# Patient Record
Sex: Female | Born: 1970 | Race: White | Hispanic: No | Marital: Married | Smoking: Never smoker
Health system: Southern US, Community
[De-identification: ages and names within clinical notes are randomized; demographics above are authoritative.]

## PROBLEM LIST (undated history)

## (undated) DIAGNOSIS — G8929 Other chronic pain: Secondary | ICD-10-CM

## (undated) DIAGNOSIS — M25562 Pain in left knee: Secondary | ICD-10-CM

## (undated) DIAGNOSIS — M25561 Pain in right knee: Secondary | ICD-10-CM

## (undated) DIAGNOSIS — K5792 Diverticulitis of intestine, part unspecified, without perforation or abscess without bleeding: Secondary | ICD-10-CM

## (undated) DIAGNOSIS — M75100 Unspecified rotator cuff tear or rupture of unspecified shoulder, not specified as traumatic: Secondary | ICD-10-CM

## (undated) DIAGNOSIS — I10 Essential (primary) hypertension: Secondary | ICD-10-CM

## (undated) DIAGNOSIS — E282 Polycystic ovarian syndrome: Secondary | ICD-10-CM

## (undated) HISTORY — PX: CHOLECYSTECTOMY: SHX55

---

## 2002-08-19 ENCOUNTER — Encounter: Admission: RE | Admit: 2002-08-19 | Discharge: 2002-08-19 | Payer: Self-pay

## 2003-07-22 ENCOUNTER — Emergency Department (HOSPITAL_COMMUNITY): Admission: EM | Admit: 2003-07-22 | Discharge: 2003-07-22 | Payer: Self-pay | Admitting: Emergency Medicine

## 2003-07-28 ENCOUNTER — Other Ambulatory Visit: Admission: RE | Admit: 2003-07-28 | Discharge: 2003-07-28 | Payer: Self-pay | Admitting: Obstetrics and Gynecology

## 2003-09-24 ENCOUNTER — Encounter: Payer: Self-pay | Admitting: Obstetrics and Gynecology

## 2003-09-24 ENCOUNTER — Emergency Department (HOSPITAL_COMMUNITY): Admission: EM | Admit: 2003-09-24 | Discharge: 2003-09-25 | Payer: Self-pay | Admitting: Emergency Medicine

## 2003-09-30 ENCOUNTER — Emergency Department (HOSPITAL_COMMUNITY): Admission: EM | Admit: 2003-09-30 | Discharge: 2003-09-30 | Payer: Self-pay | Admitting: Emergency Medicine

## 2003-10-06 ENCOUNTER — Emergency Department (HOSPITAL_COMMUNITY): Admission: EM | Admit: 2003-10-06 | Discharge: 2003-10-06 | Payer: Self-pay | Admitting: Emergency Medicine

## 2003-10-08 ENCOUNTER — Emergency Department (HOSPITAL_COMMUNITY): Admission: EM | Admit: 2003-10-08 | Discharge: 2003-10-09 | Payer: Self-pay | Admitting: Emergency Medicine

## 2004-11-11 ENCOUNTER — Ambulatory Visit (HOSPITAL_COMMUNITY): Admission: RE | Admit: 2004-11-11 | Discharge: 2004-11-11 | Payer: Self-pay | Admitting: Neurology

## 2005-02-06 ENCOUNTER — Other Ambulatory Visit: Admission: RE | Admit: 2005-02-06 | Discharge: 2005-02-06 | Payer: Self-pay | Admitting: Obstetrics and Gynecology

## 2005-09-19 ENCOUNTER — Encounter: Admission: RE | Admit: 2005-09-19 | Discharge: 2005-09-19 | Payer: Self-pay | Admitting: Obstetrics and Gynecology

## 2007-08-19 ENCOUNTER — Encounter: Admission: RE | Admit: 2007-08-19 | Discharge: 2007-08-19 | Payer: Self-pay | Admitting: General Surgery

## 2007-10-31 ENCOUNTER — Emergency Department (HOSPITAL_COMMUNITY): Admission: EM | Admit: 2007-10-31 | Discharge: 2007-10-31 | Payer: Self-pay | Admitting: Emergency Medicine

## 2008-05-26 ENCOUNTER — Emergency Department (HOSPITAL_BASED_OUTPATIENT_CLINIC_OR_DEPARTMENT_OTHER): Admission: EM | Admit: 2008-05-26 | Discharge: 2008-05-26 | Payer: Self-pay | Admitting: Emergency Medicine

## 2008-12-30 ENCOUNTER — Encounter: Admission: RE | Admit: 2008-12-30 | Discharge: 2008-12-30 | Payer: Self-pay | Admitting: Obstetrics and Gynecology

## 2009-10-23 ENCOUNTER — Ambulatory Visit: Payer: Self-pay | Admitting: Diagnostic Radiology

## 2009-10-23 ENCOUNTER — Emergency Department (HOSPITAL_BASED_OUTPATIENT_CLINIC_OR_DEPARTMENT_OTHER): Admission: EM | Admit: 2009-10-23 | Discharge: 2009-10-24 | Payer: Self-pay | Admitting: Emergency Medicine

## 2010-10-08 ENCOUNTER — Encounter: Payer: Self-pay | Admitting: Gastroenterology

## 2010-12-07 LAB — COMPREHENSIVE METABOLIC PANEL
ALT: 12 U/L (ref 0–35)
AST: 16 U/L (ref 0–37)
Albumin: 3.5 g/dL (ref 3.5–5.2)
Alkaline Phosphatase: 77 U/L (ref 39–117)
BUN: 7 mg/dL (ref 6–23)
CO2: 28 mEq/L (ref 19–32)
Calcium: 8.2 mg/dL — ABNORMAL LOW (ref 8.4–10.5)
Chloride: 108 mEq/L (ref 96–112)
Creatinine, Ser: 0.6 mg/dL (ref 0.4–1.2)
GFR calc Af Amer: >60 mL/min (ref >60)
GFR calc non Af Amer: >60 mL/min (ref >60)
Glucose, Bld: 92 mg/dL (ref 70–99)
Potassium: 3.7 mEq/L (ref 3.5–5.1)
Sodium: 142 mEq/L (ref 135–145)
Total Bilirubin: 0.4 mg/dL (ref 0.3–1.2)
Total Protein: 6.5 g/dL (ref 6.0–8.3)

## 2010-12-07 LAB — CBC
HCT: 39.2 % (ref 36.0–46.0)
Hemoglobin: 13.5 g/dL (ref 12.0–15.0)
MCHC: 34.4 g/dL (ref 30.0–36.0)
MCV: 85.3 fL (ref 78.0–100.0)
Platelets: 283 10*3/uL (ref 150–400)
RBC: 4.6 MIL/uL (ref 3.87–5.11)
RDW: 13.1 % (ref 11.5–15.5)
WBC: 11.3 10*3/uL — ABNORMAL HIGH (ref 4.0–10.5)

## 2010-12-07 LAB — URINALYSIS, ROUTINE W REFLEX MICROSCOPIC
Bilirubin Urine: NEGATIVE
Glucose, UA: NEGATIVE mg/dL
Hgb urine dipstick: NEGATIVE
Ketones, ur: NEGATIVE mg/dL
Nitrite: NEGATIVE
Protein, ur: NEGATIVE mg/dL
Specific Gravity, Urine: 1.015 (ref 1.005–1.030)
Urobilinogen, UA: 0.2 mg/dL (ref 0.0–1.0)
pH: 6 (ref 5.0–8.0)

## 2010-12-07 LAB — DIFFERENTIAL
Basophils Absolute: 0.3 10*3/uL — ABNORMAL HIGH (ref 0.0–0.1)
Basophils Relative: 3 % — ABNORMAL HIGH (ref 0–1)
Eosinophils Absolute: 0.2 10*3/uL (ref 0.0–0.7)
Eosinophils Relative: 2 % (ref 0–5)
Lymphocytes Relative: 21 % (ref 12–46)
Lymphs Abs: 2.4 10*3/uL (ref 0.7–4.0)
Monocytes Absolute: 0.7 10*3/uL (ref 0.1–1.0)
Monocytes Relative: 6 % (ref 3–12)
Neutro Abs: 7.6 10*3/uL (ref 1.7–7.7)
Neutrophils Relative %: 68 % (ref 43–77)

## 2010-12-07 LAB — PREGNANCY, URINE: Preg Test, Ur: NEGATIVE

## 2010-12-07 LAB — LIPASE, BLOOD: Lipase: 58 U/L (ref 23–300)

## 2011-01-23 ENCOUNTER — Ambulatory Visit (HOSPITAL_COMMUNITY)
Admission: RE | Admit: 2011-01-23 | Discharge: 2011-01-24 | Disposition: A | Discharge status: Home / Self Care | Payer: BC Managed Care – PPO | Source: Ambulatory Visit | Attending: Obstetrics and Gynecology | Admitting: Obstetrics and Gynecology

## 2011-01-23 ENCOUNTER — Other Ambulatory Visit: Payer: Self-pay | Admitting: Obstetrics and Gynecology

## 2011-01-23 DIAGNOSIS — N920 Excessive and frequent menstruation with regular cycle: Secondary | ICD-10-CM | POA: Insufficient documentation

## 2011-01-23 DIAGNOSIS — N840 Polyp of corpus uteri: Secondary | ICD-10-CM | POA: Insufficient documentation

## 2011-01-23 LAB — CBC
HCT: 41.4 % (ref 36.0–46.0)
Hemoglobin: 13.8 g/dL (ref 12.0–15.0)
MCH: 29 pg (ref 26.0–34.0)
MCHC: 33.3 g/dL (ref 30.0–36.0)
MCV: 87 fL (ref 78.0–100.0)
Platelets: 283 10*3/uL (ref 150–400)
RBC: 4.76 MIL/uL (ref 3.87–5.11)

## 2011-01-23 LAB — PREGNANCY, URINE: Preg Test, Ur: NEGATIVE

## 2011-01-24 NOTE — Op Note (Signed)
  Allison Frost, Allison Frost                 ACCOUNT NO.:  [id number]  MEDICAL RECORD NO.:  [id number]           PATIENT TYPE:  O  LOCATION:  9306                          FACILITY:  [facility]  PHYSICIAN:  Perlita Forbush L. Ziyon Soltau, M.D.DATE OF BIRTH:  1971-03-16  DATE OF PROCEDURE:  01/23/2011 DATE OF DISCHARGE:                              OPERATIVE REPORT   PREOPERATIVE DIAGNOSIS:  Menorrhagia and possible endometrial polyp.  POSTOPERATIVE DIAGNOSIS:  Menorrhagia and possible endometrial polyp.  PROCEDURE:  Dilation and curettage, hysteroscopy, ThermaChoice endometrial ablation.  SURGEON:  Yasha Tibbett L. Vincente Poli, MD  ANESTHESIA:  MAC with local.  FINDINGS:  Small endometrial polyp.  SPECIMENS:  Uterine curettings, sent to pathology.  ESTIMATED BLOOD LOSS:  Minimal.  COMPLICATIONS:  None.  PROCEDURE DESCRIPTION:  The patient was taken to the operating room. Her anesthesia was administered.  She was prepped and draped.  The speculum was inserted to the vagina.  The cervix was grasped with a tenaculum and a paracervical block was performed in standard fashion. The hysteroscope was then inserted and with excellent visualization, we noted a moderate amount of endometrial tissue and possible endometrial polyp.  The hysteroscope was removed and a thorough sharp uterine curettage was performed with retrieval of moderate amount of tissue. Polyp forceps were inserted.  The remainder of the tissue was removed. The hysteroscope was inserted.  The uterine cavity was clean.  The ThermaChoice III balloon was inserted and ThermaChoice endometrial ablation was performed for 8 minutes according to the manufacturer's specifications.  At the end of the procedure, intact balloon was removed.  There was no bleeding noted.  All instruments were removed from the vagina.  All sponge, lap, and instrument counts were correct x2.  The patient went to recovery room in stable condition.     Latonya Nelon L. Vincente Poli,  M.D.    Florestine Avers  D:  01/23/2011  T:  01/24/2011  Job:  [id number]  Electronically Signed by Marcelle Overlie M.D. on 01/24/2011 07:25:55 AM

## 2011-06-08 ENCOUNTER — Other Ambulatory Visit (HOSPITAL_BASED_OUTPATIENT_CLINIC_OR_DEPARTMENT_OTHER): Payer: Self-pay | Admitting: Family Medicine

## 2011-06-08 ENCOUNTER — Ambulatory Visit (HOSPITAL_BASED_OUTPATIENT_CLINIC_OR_DEPARTMENT_OTHER)
Admission: RE | Admit: 2011-06-08 | Discharge: 2011-06-08 | Disposition: A | Discharge status: Home / Self Care | Payer: BC Managed Care – PPO | Source: Ambulatory Visit | Attending: Family Medicine | Admitting: Family Medicine

## 2011-06-08 DIAGNOSIS — K5289 Other specified noninfective gastroenteritis and colitis: Secondary | ICD-10-CM | POA: Insufficient documentation

## 2011-06-08 DIAGNOSIS — R109 Unspecified abdominal pain: Secondary | ICD-10-CM

## 2011-06-08 MED ORDER — IOHEXOL 300 MG/ML  SOLN
100.0000 mL | Freq: Once | INTRAMUSCULAR | Status: AC | PRN
  Administered 2011-06-08: 100 mL via INTRAVENOUS

## 2011-06-21 LAB — URINALYSIS, ROUTINE W REFLEX MICROSCOPIC
Bilirubin Urine: NEGATIVE
Glucose, UA: NEGATIVE
Hgb urine dipstick: NEGATIVE
Ketones, ur: NEGATIVE
Nitrite: NEGATIVE
Protein, ur: NEGATIVE
Specific Gravity, Urine: 1.02
Urobilinogen, UA: 0.2
pH: 7

## 2011-06-21 LAB — COMPREHENSIVE METABOLIC PANEL
ALT: 19
AST: 32
Albumin: 4.2
Alkaline Phosphatase: 91
Chloride: 102
GFR calc Af Amer: >60
Potassium: 4.1
Sodium: 141
Total Bilirubin: 0.7
Total Protein: 7.8

## 2011-06-21 LAB — CBC
HCT: 40.9
Platelets: 301
RDW: 12.5
WBC: 8.6

## 2011-06-21 LAB — DIFFERENTIAL
Basophils Absolute: 0.1
Basophils Relative: 1
Eosinophils Absolute: 0.2
Eosinophils Relative: 3
Monocytes Absolute: 0.4
Monocytes Relative: 4
Neutro Abs: 5.8

## 2011-06-21 LAB — PREGNANCY, URINE: Preg Test, Ur: NEGATIVE

## 2011-12-23 ENCOUNTER — Emergency Department (INDEPENDENT_AMBULATORY_CARE_PROVIDER_SITE_OTHER): Payer: BC Managed Care – PPO

## 2011-12-23 ENCOUNTER — Encounter (HOSPITAL_BASED_OUTPATIENT_CLINIC_OR_DEPARTMENT_OTHER): Payer: Self-pay | Admitting: *Deleted

## 2011-12-23 ENCOUNTER — Emergency Department (HOSPITAL_BASED_OUTPATIENT_CLINIC_OR_DEPARTMENT_OTHER)
Admission: EM | Admit: 2011-12-23 | Discharge: 2011-12-23 | Disposition: A | Discharge status: Home / Self Care | Payer: BC Managed Care – PPO | Attending: Emergency Medicine | Admitting: Emergency Medicine

## 2011-12-23 DIAGNOSIS — K429 Umbilical hernia without obstruction or gangrene: Secondary | ICD-10-CM

## 2011-12-23 DIAGNOSIS — K5792 Diverticulitis of intestine, part unspecified, without perforation or abscess without bleeding: Secondary | ICD-10-CM

## 2011-12-23 DIAGNOSIS — N859 Noninflammatory disorder of uterus, unspecified: Secondary | ICD-10-CM

## 2011-12-23 DIAGNOSIS — K5732 Diverticulitis of large intestine without perforation or abscess without bleeding: Secondary | ICD-10-CM | POA: Insufficient documentation

## 2011-12-23 DIAGNOSIS — R935 Abnormal findings on diagnostic imaging of other abdominal regions, including retroperitoneum: Secondary | ICD-10-CM

## 2011-12-23 DIAGNOSIS — N852 Hypertrophy of uterus: Secondary | ICD-10-CM

## 2011-12-23 DIAGNOSIS — R1032 Left lower quadrant pain: Secondary | ICD-10-CM | POA: Insufficient documentation

## 2011-12-23 LAB — URINALYSIS, ROUTINE W REFLEX MICROSCOPIC
Bilirubin Urine: NEGATIVE
Glucose, UA: NEGATIVE mg/dL
Hgb urine dipstick: NEGATIVE
Protein, ur: NEGATIVE mg/dL

## 2011-12-23 LAB — PREGNANCY, URINE: Preg Test, Ur: NEGATIVE

## 2011-12-23 LAB — BASIC METABOLIC PANEL
BUN: 6 mg/dL (ref 6–23)
Chloride: 103 mEq/L (ref 96–112)
Creatinine, Ser: 0.6 mg/dL (ref 0.50–1.10)
Glucose, Bld: 85 mg/dL (ref 70–99)
Potassium: 3.4 mEq/L — ABNORMAL LOW (ref 3.5–5.1)

## 2011-12-23 LAB — CBC
HCT: 39.6 % (ref 36.0–46.0)
Hemoglobin: 13.6 g/dL (ref 12.0–15.0)
MCHC: 34.3 g/dL (ref 30.0–36.0)
MCV: 86.3 fL (ref 78.0–100.0)

## 2011-12-23 MED ORDER — IOHEXOL 300 MG/ML  SOLN
100.0000 mL | Freq: Once | INTRAMUSCULAR | Status: AC | PRN
  Administered 2011-12-23: 100 mL via INTRAVENOUS

## 2011-12-23 MED ORDER — ONDANSETRON HCL 4 MG/2ML IJ SOLN
4.0000 mg | Freq: Once | INTRAMUSCULAR | Status: AC
  Administered 2011-12-23: 4 mg via INTRAVENOUS
  Filled 2011-12-23: qty 2

## 2011-12-23 MED ORDER — CIPROFLOXACIN HCL 500 MG PO TABS: 500.0000 mg | ORAL_TABLET | Freq: Two times a day (BID) | ORAL | Status: AC

## 2011-12-23 MED ORDER — HYDROMORPHONE HCL PF 1 MG/ML IJ SOLN
1.0000 mg | Freq: Once | INTRAMUSCULAR | Status: AC
  Administered 2011-12-23: 1 mg via INTRAVENOUS
  Filled 2011-12-23: qty 1

## 2011-12-23 MED ORDER — IOHEXOL 300 MG/ML  SOLN
20.0000 mL | INTRAMUSCULAR | Status: AC
  Administered 2011-12-23: 20 mL via ORAL

## 2011-12-23 MED ORDER — HYDROCODONE-ACETAMINOPHEN 5-325 MG PO TABS: 2.0000 | ORAL_TABLET | ORAL | Status: AC | PRN

## 2011-12-23 MED ORDER — METRONIDAZOLE 500 MG PO TABS: 500.0000 mg | ORAL_TABLET | Freq: Two times a day (BID) | ORAL | Status: DC

## 2011-12-23 MED ORDER — ONDANSETRON HCL 4 MG PO TABS: 4.0000 mg | ORAL_TABLET | Freq: Four times a day (QID) | ORAL | Status: DC

## 2011-12-23 MED ORDER — METRONIDAZOLE 500 MG PO TABS: 500.0000 mg | ORAL_TABLET | Freq: Two times a day (BID) | ORAL | Status: AC

## 2011-12-23 MED ORDER — ONDANSETRON HCL 4 MG PO TABS: 4.0000 mg | ORAL_TABLET | Freq: Four times a day (QID) | ORAL | Status: AC

## 2011-12-23 MED ORDER — CIPROFLOXACIN HCL 500 MG PO TABS: 500.0000 mg | ORAL_TABLET | Freq: Two times a day (BID) | ORAL | Status: DC

## 2011-12-23 NOTE — ED Provider Notes (Signed)
History     CSN: [id number]  Arrival date & time 12/23/11  1357   First MD Initiated Contact with Patient 12/23/11 1446      Chief Complaint  Patient presents with  . Abdominal Pain    (Consider location/radiation/quality/duration/timing/severity/associated sxs/prior treatment) HPI  Patient presents to the emergency department from the [facility] walk-in clinic with complaints of abdominal pain starting 2 days ago. The patient has a past medical history of diverticulitis and she states that this feels like the same. She has a gastrointestinal doctor at [facility] who she called this morning or for her to the [place] walk-in clinic where they referred her here to the ED. The patient states her pain is left lower quadrant. Denies having diarrhea or vomiting but states that the pain is so bad that she could not take it anymore. She has also been gassy. Has a history of cholecystectomy.admits to still having appendix. The patient denies any other complaints or concerns at this time.  History reviewed. No pertinent past medical history.  Past Surgical History  Procedure Date  . Cholecystectomy     History reviewed. No pertinent family history.  History  Substance Use Topics  . Smoking status: Never Smoker   . Smokeless tobacco: Not on file  . Alcohol Use: No    OB History    Grav Para Term Preterm Abortions TAB SAB Ect Mult Living                  Review of Systems  All other systems reviewed and are negative.    Allergies  Review of patient's allergies indicates no known allergies.  Home Medications  No current outpatient prescriptions on file.  BP 146/86  Pulse 70  Temp(Src) 98.1 F (36.7 C) (Oral)  Resp 20  Ht 5\' 4"  (1.626 m)  Wt 235 lb (106.595 kg)  BMI 40.34 kg/m2  SpO2 99%  LMP 12/09/2011  Physical Exam  Nursing note and vitals reviewed. Constitutional: She appears well-developed and well-nourished. No distress.  HENT:  Head: Normocephalic and atraumatic.    Eyes: Pupils are equal, round, and reactive to light.  Neck: Normal range of motion. Neck supple.  Cardiovascular: Normal rate and regular rhythm.   Pulmonary/Chest: Effort normal.  Abdominal: Soft. There is tenderness in the left lower quadrant. There is no rigidity, no rebound and no guarding.  Neurological: She is alert.  Skin: Skin is warm and dry.    ED Course  Procedures (including critical care time)   Labs Reviewed  URINALYSIS, ROUTINE W REFLEX MICROSCOPIC  PREGNANCY, URINE   No results found.   No diagnosis found.    MDM  pts last CT scan was 6 months ago and it was positive for diverticulitis.   Pt handed off to Langston Masker, PA-C at end of shift      Dorthula Matas, [place] 12/23/11 [phone]

## 2011-12-23 NOTE — Discharge Instructions (Signed)
Diverticulitis Small pockets or "bubbles" can develop in the wall of the intestine. Diverticulitis is when those pockets become infected and inflamed. This causes stomach pain (usually on the left side). HOME CARE  Take all medicine as told by your doctor.   Try a clear liquid diet (broth, tea, or water) for as long as told by your doctor.   Keep all follow-up visits with your doctor.   You may be put on a low-fiber diet once you start feeling better. Here are foods that have low-fiber:   White breads, cereals, rice, and pasta.   Cooked fruits and vegetables or soft fresh fruits and vegetables without the skin.   Ground or well-cooked tender beef, ham, veal, lamb, pork, or poultry.   Eggs and seafood.   After you are doing well on the low-fiber diet, you may be put on a high-fiber diet. Here are ways to increase your fiber:   Choose whole-grain breads, cereals, pasta, and brown rice.   Choose fruits and vegetables with skin on. Do not overcook the vegetables.   Choose nuts, seeds, legumes, dried peas, beans, and lentils.   Look for food products that have more than 3 grams of fiber per serving on the food label.  GET HELP RIGHT AWAY IF:  Your pain does not get better or gets worse.   You have trouble eating food.   You are not pooping (having bowel movements) like normal.   You have a temperature by mouth above 102 F (38.9 C), not controlled by medicine.   You keep throwing up (vomiting).   You have bloody or black, tarry poop (stools).   You are getting worse and not better.  MAKE SURE YOU:   Understand these instructions.   Will watch your condition.   Will get help right away if you are not doing well or get worse.  Document Released: 02/21/2008 Document Revised: 08/24/2011 Document Reviewed: 07/26/2009 Exit[employer] Patient Information 2012 [employer]. 

## 2011-12-23 NOTE — ED Notes (Signed)
Pt sent here from [facility] for further testing. C/O left side pain x 2 days. "Gassy after eating".

## 2011-12-23 NOTE — ED Provider Notes (Signed)
History     CSN: [id number]  Arrival date & time 12/23/11  1357   First MD Initiated Contact with Patient 12/23/11 1446      Chief Complaint  Patient presents with  . Abdominal Pain    (Consider location/radiation/quality/duration/timing/severity/associated sxs/prior treatment) HPI  History reviewed. No pertinent past medical history.  Past Surgical History  Procedure Date  . Cholecystectomy     History reviewed. No pertinent family history.  History  Substance Use Topics  . Smoking status: Never Smoker   . Smokeless tobacco: Not on file  . Alcohol Use: No    OB History    Grav Para Term Preterm Abortions TAB SAB Ect Mult Living                  Review of Systems  Allergies  Review of patient's allergies indicates no known allergies.  Home Medications   Current Outpatient Rx  Name Route Sig Dispense Refill  . ALLERGY D-12 PO Oral Take 1 tablet by mouth daily.    Marland Kitchen VITAMIN D3 2000 UNITS PO TABS Oral Take 1 tablet by mouth daily.    . OMEGA-3 FATTY ACIDS 1000 MG PO CAPS Oral Take 2 g by mouth daily.    Marland Kitchen FLURBIPROFEN 100 MG PO TABS Oral Take 100 mg by mouth 2 (two) times daily.    Marland Kitchen GLUCOSAMINE-CHONDROITIN 500-400 MG PO TABS Oral Take 1 tablet by mouth 2 (two) times daily. Hold while in hospital    . THERA M PLUS PO TABS Oral Take 1 tablet by mouth daily.    Marland Kitchen NARATRIPTAN HCL 2.5 MG PO TABS Oral Take 2.5 mg by mouth as needed. Take one (1) tablet at onset of headache; if returns or does not resolve, may repeat after 4 hours; do not exceed five (5) mg in 24 hours. For headache      BP 124/92  Pulse 68  Temp(Src) 98.1 F (36.7 C) (Oral)  Resp 18  Ht 5\' 4"  (1.626 m)  Wt 235 lb (106.595 kg)  BMI 40.34 kg/m2  SpO2 99%  LMP 12/09/2011  Physical Exam  ED Course  Procedures (including critical care time)  Labs Reviewed  BASIC METABOLIC PANEL - Abnormal; Notable for the following:    Potassium 3.4 (*)    All other components within normal limits    URINALYSIS, ROUTINE W REFLEX MICROSCOPIC  PREGNANCY, URINE  CBC   Ct Abdomen Pelvis W Contrast  12/23/2011  *RADIOLOGY REPORT*  Clinical Data: Left lower quadrant pain  CT ABDOMEN AND PELVIS WITH CONTRAST  Technique:  Multidetector CT imaging of the abdomen and pelvis was performed following the standard protocol during bolus administration of intravenous contrast.  Contrast: OMNIPAQUE IOHEXOL 300 MG/ML  SOLN  Comparison: 06/08/2011  Findings: Lung bases are unremarkable.  Sagittal images of the spine are unremarkable.  The patient is status post cholecystectomy.  Mixed hypo and hyperdense lesion in the right hepatic lobe inferiorly measures about 1 cm stable in size and appearance from prior exam probable benign in nature.  No intrahepatic biliary ductal dilatation.  The pancreas, spleen and adrenals are unremarkable.  Kidneys are symmetrical in size and enhancement without evidence of focal mass.  No hydronephrosis or hydroureter.  Abdominal aorta is unremarkable.  There is a small umbilical hernia containing fat without evidence of acute complication.  Normal appendix is clearly visualized in axial image 56.  There is no pericecal inflammation.  In axial image 49 there is a focal ring-like stranding  of the mesenteric fat just anterior to the left colon in the left lower quadrant the findings are suspicious for epiploic appendagitis or less likely mild diverticulitis.  No thickening of the colonic wall is noted.  No mesenteric fluid collection or abscess is noted.  Mild enlarged uterus.  Small amount of fluid noted in the posterior aspect of the endometrial canal.  There is a nodular prominence of the uterine fundus measures about 2 cm.  This is suspicious for a myometrial fibroid.  The ovaries are unremarkable.  Correlation with ultrasound could be performed.  The urinary bladder is unremarkable.  Delayed renal images shows bilateral renal symmetrical excretion.  IMPRESSION:  1. There is focal  ring-like stranding in the left lower quadrant mesentery, anterior to descending colon. Best seen in the axial image 25 of series 7.  Findings are highly suspicious for epiploic appendagitis or less likely focal diverticulitis. 2.  No pericolonic abscess or fluid collection. 3.  Normal appendix.  4.  No hydronephrosis or hydroureter. 5.  Small umbilical hernia without evidence of acute complication. 6.  There is enlarged uterus.  A nodular prominence of the uterine fundus suspicious for a myometrial fibroid measures about 2 cm. Correlation with pelvic ultrasound could be performed.  Original Report Authenticated By: Natasha Mead, M.D.     No diagnosis found.    MDM  Ct shows diverticulitis.  Pt given rx for flagyl, cipro and hydrocodone.  Pt advised to follow up with her MD for recheck on Monday        Lonia Skinner Per[place], [place] 12/23/11 Rickey Primus

## 2011-12-24 NOTE — ED Provider Notes (Signed)
Medical screening examination/treatment/procedure(s) were performed by non-physician practitioner and as supervising physician I was immediately available for consultation/collaboration.   Celene Kras, MD 12/24/11 0700

## 2011-12-25 NOTE — ED Provider Notes (Signed)
History/physical exam/procedure(s) were performed by non-physician practitioner and as supervising physician I was immediately available for consultation/collaboration. I have reviewed all notes and am in agreement with care and plan.   Hilario Quarry, MD 12/25/11 [phone]

## 2012-01-11 ENCOUNTER — Other Ambulatory Visit: Payer: Self-pay | Admitting: Obstetrics and Gynecology

## 2012-05-14 ENCOUNTER — Other Ambulatory Visit: Payer: Self-pay | Admitting: Gastroenterology

## 2012-05-14 DIAGNOSIS — K5732 Diverticulitis of large intestine without perforation or abscess without bleeding: Secondary | ICD-10-CM

## 2012-05-16 ENCOUNTER — Ambulatory Visit
Admission: RE | Admit: 2012-05-16 | Discharge: 2012-05-16 | Disposition: A | Discharge status: Home / Self Care | Payer: BC Managed Care – PPO | Source: Ambulatory Visit | Attending: Gastroenterology | Admitting: Gastroenterology

## 2012-05-16 ENCOUNTER — Other Ambulatory Visit: Payer: BC Managed Care – PPO

## 2012-05-16 DIAGNOSIS — K5732 Diverticulitis of large intestine without perforation or abscess without bleeding: Secondary | ICD-10-CM

## 2012-05-16 MED ORDER — IOHEXOL 300 MG/ML  SOLN
125.0000 mL | Freq: Once | INTRAMUSCULAR | Status: AC | PRN
  Administered 2012-05-16: 125 mL via INTRAVENOUS

## 2012-05-17 ENCOUNTER — Other Ambulatory Visit: Payer: BC Managed Care – PPO

## 2013-01-31 ENCOUNTER — Other Ambulatory Visit: Payer: Self-pay | Admitting: Obstetrics and Gynecology

## 2013-10-21 ENCOUNTER — Other Ambulatory Visit: Payer: Self-pay | Admitting: Gastroenterology

## 2013-10-21 DIAGNOSIS — R1012 Left upper quadrant pain: Secondary | ICD-10-CM

## 2013-10-24 ENCOUNTER — Ambulatory Visit
Admission: RE | Admit: 2013-10-24 | Discharge: 2013-10-24 | Disposition: A | Discharge status: Home / Self Care | Payer: BC Managed Care – PPO | Source: Ambulatory Visit | Attending: Gastroenterology | Admitting: Gastroenterology

## 2013-10-24 DIAGNOSIS — R1012 Left upper quadrant pain: Secondary | ICD-10-CM

## 2013-10-24 MED ORDER — IOHEXOL 300 MG/ML  SOLN
125.0000 mL | Freq: Once | INTRAMUSCULAR | Status: AC | PRN
  Administered 2013-10-24: 125 mL via INTRAVENOUS

## 2014-03-19 ENCOUNTER — Other Ambulatory Visit: Payer: Self-pay | Admitting: Obstetrics and Gynecology

## 2014-03-23 LAB — CYTOLOGY - PAP

## 2015-05-13 ENCOUNTER — Other Ambulatory Visit: Payer: Self-pay | Admitting: Obstetrics and Gynecology

## 2015-05-14 LAB — CYTOLOGY - PAP

## 2015-10-29 ENCOUNTER — Other Ambulatory Visit (HOSPITAL_BASED_OUTPATIENT_CLINIC_OR_DEPARTMENT_OTHER): Payer: Self-pay | Admitting: Family Medicine

## 2015-10-29 ENCOUNTER — Ambulatory Visit (HOSPITAL_BASED_OUTPATIENT_CLINIC_OR_DEPARTMENT_OTHER)
Admission: RE | Admit: 2015-10-29 | Discharge: 2015-10-29 | Disposition: A | Discharge status: Home / Self Care | Payer: 59 | Source: Ambulatory Visit | Attending: Family Medicine | Admitting: Family Medicine

## 2015-10-29 ENCOUNTER — Encounter (HOSPITAL_BASED_OUTPATIENT_CLINIC_OR_DEPARTMENT_OTHER): Payer: Self-pay

## 2015-10-29 DIAGNOSIS — K921 Melena: Secondary | ICD-10-CM | POA: Insufficient documentation

## 2015-10-29 DIAGNOSIS — R109 Unspecified abdominal pain: Secondary | ICD-10-CM

## 2015-10-29 DIAGNOSIS — K439 Ventral hernia without obstruction or gangrene: Secondary | ICD-10-CM | POA: Insufficient documentation

## 2015-10-29 DIAGNOSIS — D1803 Hemangioma of intra-abdominal structures: Secondary | ICD-10-CM | POA: Insufficient documentation

## 2015-10-29 DIAGNOSIS — R509 Fever, unspecified: Secondary | ICD-10-CM | POA: Diagnosis not present

## 2015-10-29 DIAGNOSIS — K5730 Diverticulosis of large intestine without perforation or abscess without bleeding: Secondary | ICD-10-CM | POA: Insufficient documentation

## 2015-10-29 DIAGNOSIS — K529 Noninfective gastroenteritis and colitis, unspecified: Secondary | ICD-10-CM | POA: Insufficient documentation

## 2015-10-29 DIAGNOSIS — R1084 Generalized abdominal pain: Secondary | ICD-10-CM | POA: Insufficient documentation

## 2015-10-29 MED ORDER — IOHEXOL 300 MG/ML  SOLN
100.0000 mL | Freq: Once | INTRAMUSCULAR | Status: AC | PRN
  Administered 2015-10-29: 100 mL via INTRAVENOUS

## 2016-10-09 ENCOUNTER — Ambulatory Visit (HOSPITAL_COMMUNITY)
Admission: RE | Admit: 2016-10-09 | Discharge: 2016-10-09 | Disposition: A | Discharge status: Home / Self Care | Payer: BLUE CROSS/BLUE SHIELD | Source: Ambulatory Visit | Attending: Orthopedic Surgery | Admitting: Orthopedic Surgery

## 2016-10-09 ENCOUNTER — Other Ambulatory Visit (HOSPITAL_COMMUNITY): Payer: Self-pay | Admitting: Orthopedic Surgery

## 2016-10-09 DIAGNOSIS — M79604 Pain in right leg: Secondary | ICD-10-CM

## 2016-10-09 DIAGNOSIS — M7989 Other specified soft tissue disorders: Principal | ICD-10-CM

## 2016-10-09 NOTE — Progress Notes (Signed)
Preliminary results by tech - Venous Duplex Lower Ext. Right Completed. Negative for deep and superficial vein thrombosis in the right leg. Called Dr. Alvester Morin office and left message on voicemail to call.  Oda Cogan, BS, RDMS, RVT

## 2016-10-16 ENCOUNTER — Emergency Department (HOSPITAL_COMMUNITY)
Admission: EM | Admit: 2016-10-16 | Discharge: 2016-10-16 | Disposition: A | Discharge status: Home / Self Care | Payer: BLUE CROSS/BLUE SHIELD | Attending: Dermatology | Admitting: Dermatology

## 2016-10-16 ENCOUNTER — Encounter (HOSPITAL_COMMUNITY): Payer: Self-pay | Admitting: *Deleted

## 2016-10-16 DIAGNOSIS — Z5321 Procedure and treatment not carried out due to patient leaving prior to being seen by health care provider: Secondary | ICD-10-CM | POA: Insufficient documentation

## 2016-10-16 DIAGNOSIS — I10 Essential (primary) hypertension: Secondary | ICD-10-CM | POA: Insufficient documentation

## 2016-10-16 HISTORY — DX: Unspecified rotator cuff tear or rupture of unspecified shoulder, not specified as traumatic: M75.100

## 2016-10-16 HISTORY — DX: Essential (primary) hypertension: I10

## 2016-10-16 NOTE — ED Triage Notes (Signed)
PT states hx of htn that is uncontrolled despite taking medication.  Pt denies headache, dizziness or blurred vision.  She feels her high blood pressure is related to uncontrolled pain from bil rotator cuff tears.

## 2016-10-16 NOTE — ED Notes (Signed)
Pt states she called her PCP and they will provide pain medication to patient; she states she does not want to wait to be seen by an EDP and leaves the ER ambulatory with steady gait and family

## 2016-10-19 ENCOUNTER — Other Ambulatory Visit: Payer: Self-pay | Admitting: *Deleted

## 2016-10-19 ENCOUNTER — Ambulatory Visit (INDEPENDENT_AMBULATORY_CARE_PROVIDER_SITE_OTHER): Payer: BLUE CROSS/BLUE SHIELD | Admitting: Orthopedic Surgery

## 2016-10-19 ENCOUNTER — Encounter (INDEPENDENT_AMBULATORY_CARE_PROVIDER_SITE_OTHER): Payer: Self-pay | Admitting: Orthopedic Surgery

## 2016-10-19 ENCOUNTER — Ambulatory Visit (INDEPENDENT_AMBULATORY_CARE_PROVIDER_SITE_OTHER): Payer: BLUE CROSS/BLUE SHIELD | Admitting: Neurology

## 2016-10-19 DIAGNOSIS — G43809 Other migraine, not intractable, without status migrainosus: Secondary | ICD-10-CM | POA: Diagnosis not present

## 2016-10-19 DIAGNOSIS — M75112 Incomplete rotator cuff tear or rupture of left shoulder, not specified as traumatic: Secondary | ICD-10-CM | POA: Diagnosis not present

## 2016-10-19 DIAGNOSIS — R252 Cramp and spasm: Secondary | ICD-10-CM | POA: Diagnosis not present

## 2016-10-19 DIAGNOSIS — R200 Anesthesia of skin: Secondary | ICD-10-CM

## 2016-10-19 DIAGNOSIS — I10 Essential (primary) hypertension: Secondary | ICD-10-CM | POA: Diagnosis not present

## 2016-10-19 DIAGNOSIS — M25511 Pain in right shoulder: Secondary | ICD-10-CM | POA: Diagnosis not present

## 2016-10-19 DIAGNOSIS — M545 Low back pain: Secondary | ICD-10-CM | POA: Diagnosis not present

## 2016-10-19 DIAGNOSIS — R9431 Abnormal electrocardiogram [ECG] [EKG]: Secondary | ICD-10-CM | POA: Diagnosis not present

## 2016-10-19 NOTE — Procedures (Signed)
[facility]  [place], [place]  [place], [place] [address] Tel: [phone] Fax:  [phone] Test Date:  10/19/2016  Patient: Allison Frost DOB: 1971/06/29 Physician: Narda Amber, DO  Sex: Female Height: 5\' 4"  Ref Phys: Flossie Dibble, M.D.  ID#: [id number] Temp: 32.7C Technician: Jerilynn Mages. Dean   Patient Complaints: This is a 46 year old female referred for evaluation of bilateral arm pain and paresthesias.   NCV & EMG Findings: Extensive electrodiagnostic testing of the left upper extremity and additional studies of the right shows:  1. Bilateral median, ulnar, and mixed palmar sensory responses are within normal limits. 2. Bilateral median and ulnar motor responses are within normal limits. 3. There is no evidence of active or chronic motor axon loss changes affecting any of the tested muscles. Motor unit configuration and recruitment pattern is within normal limits.  Impression: This is a normal study of bilateral upper extremities.  In particular, there is no evidence of a cervical radiculopathy or carpal tunnel syndrome.  ___________________________ Narda Amber, DO    Nerve Conduction Studies Anti Sensory Summary Table   Site NR Peak (ms) Norm Peak (ms) P-T Amp (V) Norm P-T Amp  Left Median Anti Sensory (2nd Digit)  32.7C  Wrist    3.3 <3.4 23.5 >20  Right Median Anti Sensory (2nd Digit)  32.7C  Wrist    3.4 <3.4 48.3 >20  Left Ulnar Anti Sensory (5th Digit)  32.7C  Wrist    2.8 <3.1 15.7 >12  Right Ulnar Anti Sensory (5th Digit)  32.7C  Wrist    3.0 <3.1 15.7 >12   Motor Summary Table   Site NR Onset (ms) Norm Onset (ms) O-P Amp (mV) Norm O-P Amp Site1 Site2 Delta-0 (ms) Dist (cm) Vel (m/s) Norm Vel (m/s)  Left Median Motor (Abd Poll Brev)  32.7C  Wrist    3.5 <3.9 7.6 >6 Elbow Wrist 3.9 23.0 59 >50  Elbow    7.4  7.4         Right Median Motor (Abd Poll Brev)  32.7C  Wrist    3.4 <3.9 6.4 >6 Elbow Wrist 5.0 26.0 52 >50  Elbow    8.4  5.4          Left Ulnar Motor (Abd Dig Minimi)  32.7C  Wrist    2.9 <3.1 8.1 >7 B Elbow Wrist 3.2 19.0 59 >50  B Elbow    6.1  7.5  A Elbow B Elbow 1.8 10.0 56 >50  A Elbow    7.9  7.5         Right Ulnar Motor (Abd Dig Minimi)  32.7C  Wrist    2.8 <3.1 10.4 >7 B Elbow Wrist 3.3 19.0 58 >50  B Elbow    6.1  10.3  A Elbow B Elbow 1.6 10.0 62 >50  A Elbow    7.7  9.7          Comparison Summary Table   Site NR Peak (ms) Norm Peak (ms) P-T Amp (V) Site1 Site2 Delta-P (ms) Norm Delta (ms)  Left Median/Ulnar Palm Comparison (Wrist - 8cm)  32.7C  Median Palm    2.1 <2.2 56.1 Median Palm Ulnar Palm 0.2   Ulnar Palm    1.9 <2.2 16.4      Right Median/Ulnar Palm Comparison (Wrist - 8cm)  32.7C  Median Palm    2.1 <2.2 84.9 Median Palm Ulnar Palm 0.3   Ulnar Palm    1.8 <2.2 20.6  EMG   Side Muscle Ins Act Fibs Psw Fasc Number Recrt Dur Dur. Amp Amp. Poly Poly. Comment  Right Deltoid Nml Nml Nml Nml Nml Nml Nml Nml Nml Nml Nml Nml N/A  Right 1stDorInt Nml Nml Nml Nml Nml Nml Nml Nml Nml Nml Nml Nml N/A  Right Ext Indicis Nml Nml Nml Nml Nml Nml Nml Nml Nml Nml Nml Nml N/A  Right PronatorTeres Nml Nml Nml Nml Nml Nml Nml Nml Nml Nml Nml Nml N/A  Right Biceps Nml Nml Nml Nml Nml Nml Nml Nml Nml Nml Nml Nml N/A  Right Triceps Nml Nml Nml Nml Nml Nml Nml Nml Nml Nml Nml Nml N/A  Left 1stDorInt Nml Nml Nml Nml Nml Nml Nml Nml Nml Nml Nml Nml N/A  Left Ext Indicis Nml Nml Nml Nml Nml Nml Nml Nml Nml Nml Nml Nml N/A  Left PronatorTeres Nml Nml Nml Nml Nml Nml Nml Nml Nml Nml Nml Nml N/A  Left Biceps Nml Nml Nml Nml Nml Nml Nml Nml Nml Nml Nml Nml N/A  Left Triceps Nml Nml Nml Nml Nml Nml Nml Nml Nml Nml Nml Nml N/A  Left Deltoid Nml Nml Nml Nml Nml Nml Nml Nml Nml Nml Nml Nml N/A      Waveforms:

## 2016-10-19 NOTE — Progress Notes (Signed)
Office Visit Note   Patient: Allison Frost           Date of Birth: 06/17/1971           MRN: [id number] Visit Date: 10/19/2016              Requested by: No referring provider defined for this encounter. PCP: Leotis Pain, DO  Chief Complaint  Patient presents with  . Right Shoulder - Pain  . Left Shoulder - Pain  . Right Leg - Follow-up    Gastrocnemius calf spasms and sprain.     HPI: Patient presents today for two separate medical issues. She complains of right calf strain with spasms. And bilateral shoulder pain. Patient states she woke up with massive muscles spasm in right calf. She originally went to orthopedic urgent care for muscle spasms. She has had a doppler negative for dvt.She was told by her headache doctor that she needed orthopedic evaluation.  She has chronic pain bilateral shoulders. She states left shoulder is hurting worse than the right. She has had shoulder injection bilaterally. She was told she needed possible surgical intervention on the right but states her left shoulder is much worse. She feels like the pain is causing altering blood pressures. Patient also has had EMG/NCV studies which returned normal. Maxcine Ham, RT  Patient complains of calf cramping on the right she has been on blood pressure medication with lisinopril. Patient has had nerve conduction studies of both upper extremities which were negative for cervical pathology negative for carpal tunnel syndrome. Her MRI scans were reviewed for both upper extremities which shows partial rotator cuff tearing bilaterally she has had no relief with previous subacromial injections for both shoulders. Patient went to the ER this morning for headache.  Current medications include Valium, Amerge, tramadol, Nortrip, and lisinopril  Assessment & Plan: Visit Diagnoses: No diagnosis found.  Plan: Due to patient's failed conservative care for the partial rotator cuff tear of left shoulder she would like to  proceed with arthroscopic intervention we will plan for subacromial decompression and debridement of rotator cuff tear as well as possible debridement of the SLAP lesion. Risk and benefits were discussed including persistent pain neurovascular injury need for additional surgery discussed that most likely she would have approximate 75% improvement in her symptoms. Patient states she like to proceed with surgery we will set this up at her convenience. I recommended Coconut water for the cramping in her calf discussed the importance of potassium replacement.  Follow-Up Instructions: No Follow-up on file.   Ortho Exam Examination patient is alert oriented no adenopathy well-dressed normal affect normal respiratory effort she has a normal gait. Examination patient has pain with abduction and flexion of both shoulders with essentially full range of motion. She has pain with Neer and Hawkins impingement test and pain with drop arm test bilaterally. Her MRI scan was reviewed which showed partial tearing of the rotator cuff. Examination the patient's cast she has no venous insufficiency ulcers minimal swelling she has had no pain at the musculotendinous junction of the medial head of the gastrocnemius muscle no evidence of a traumatic tear. Patient's both upper extremities are neurovascularly intact.  Imaging: No results found.  Orders:  No orders of the defined types were placed in this encounter.  No orders of the defined types were placed in this encounter.    Procedures: No procedures performed  Clinical Data: No additional findings.  Subjective: Review of Systems  Objective: Vital  Signs: There were no vitals taken for this visit.  Specialty Comments:  No specialty comments available.  PMFS History: There are no active problems to display for this patient.  Past Medical History:  Diagnosis Date  . Hypertension   . Rotator cuff tear     No family history on file.  Past Surgical  History:  Procedure Laterality Date  . CHOLECYSTECTOMY     Social History   Occupational History  . Not on file.   Social History Main Topics  . Smoking status: Never Smoker  . Smokeless tobacco: Never Used  . Alcohol use No  . Drug use: No  . Sexual activity: Not on file

## 2016-10-23 ENCOUNTER — Telehealth: Payer: Self-pay | Admitting: Neurology

## 2016-10-23 NOTE — Telephone Encounter (Signed)
Ursala Kubit May 30, 2071. Her # [phone]. She would like you to call her back regarding some questions that she has.  She recently had a Nerve Conduction study. Thank you

## 2016-10-24 ENCOUNTER — Encounter: Payer: Self-pay | Admitting: Orthopedic Surgery

## 2016-10-24 ENCOUNTER — Telehealth (INDEPENDENT_AMBULATORY_CARE_PROVIDER_SITE_OTHER): Payer: Self-pay

## 2016-10-24 DIAGNOSIS — S43432A Superior glenoid labrum lesion of left shoulder, initial encounter: Secondary | ICD-10-CM | POA: Diagnosis not present

## 2016-10-24 DIAGNOSIS — G8918 Other acute postprocedural pain: Secondary | ICD-10-CM | POA: Diagnosis not present

## 2016-10-24 DIAGNOSIS — M75122 Complete rotator cuff tear or rupture of left shoulder, not specified as traumatic: Secondary | ICD-10-CM | POA: Diagnosis not present

## 2016-10-24 DIAGNOSIS — M7542 Impingement syndrome of left shoulder: Secondary | ICD-10-CM | POA: Diagnosis not present

## 2016-10-24 DIAGNOSIS — M75112 Incomplete rotator cuff tear or rupture of left shoulder, not specified as traumatic: Secondary | ICD-10-CM | POA: Diagnosis not present

## 2016-10-24 DIAGNOSIS — M24112 Other articular cartilage disorders, left shoulder: Secondary | ICD-10-CM | POA: Diagnosis not present

## 2016-10-24 NOTE — Telephone Encounter (Signed)
Patient states the Vicodin isn't helping her pain. She states her fingers are swollen-I told her to continue ice and to elevate more. She states she can't take Ibuprofen because it upsets her stomach. She wanted me to let you know what she hasn't isn't helping.

## 2016-10-25 ENCOUNTER — Other Ambulatory Visit (INDEPENDENT_AMBULATORY_CARE_PROVIDER_SITE_OTHER): Payer: Self-pay | Admitting: Family

## 2016-10-25 ENCOUNTER — Telehealth (INDEPENDENT_AMBULATORY_CARE_PROVIDER_SITE_OTHER): Payer: Self-pay | Admitting: Orthopedic Surgery

## 2016-10-25 MED ORDER — OXYCODONE-ACETAMINOPHEN 5-325 MG PO TABS
1.0000 | ORAL_TABLET | Freq: Four times a day (QID) | ORAL | 0 refills | Status: DC | PRN
Start: 2016-10-25 — End: 2017-01-02

## 2016-10-25 NOTE — Telephone Encounter (Signed)
Ok, can pick up a one time percocet rx

## 2016-10-25 NOTE — Telephone Encounter (Signed)
Patient called in a lot of pain from her surgery yesterday, she didn't get any sleep last night, and she has been told that she needs a stronger RX for pain. CB # [phone]

## 2016-10-25 NOTE — Telephone Encounter (Signed)
I called and spoke with patient advised her that rx for percocet would be at front desk. This is stronger than vicodin, advised her that she cannot take these two together. Advised to dc ibuprofen if this is causing her GI upset like she stated before. Her daughter Marcene Brawn is going to pick up rx, per patient. Advised her to have daughter bring photo ID.

## 2016-10-25 NOTE — Telephone Encounter (Signed)
Pt was given Percocet 5/325 yesterday had a RTC surgery.

## 2016-10-27 ENCOUNTER — Telehealth (INDEPENDENT_AMBULATORY_CARE_PROVIDER_SITE_OTHER): Payer: Self-pay | Admitting: *Deleted

## 2016-10-27 DIAGNOSIS — M25512 Pain in left shoulder: Secondary | ICD-10-CM | POA: Diagnosis not present

## 2016-10-27 NOTE — Telephone Encounter (Signed)
A nurse from [facility] called this afternoon needing an op report and a protocol please. Fax # [phone]. Thank you

## 2016-10-30 DIAGNOSIS — R21 Rash and other nonspecific skin eruption: Secondary | ICD-10-CM | POA: Diagnosis not present

## 2016-10-30 DIAGNOSIS — I10 Essential (primary) hypertension: Secondary | ICD-10-CM | POA: Diagnosis not present

## 2016-10-30 DIAGNOSIS — M25512 Pain in left shoulder: Secondary | ICD-10-CM | POA: Diagnosis not present

## 2016-10-30 DIAGNOSIS — M25511 Pain in right shoulder: Secondary | ICD-10-CM | POA: Diagnosis not present

## 2016-10-30 DIAGNOSIS — L299 Pruritus, unspecified: Secondary | ICD-10-CM | POA: Diagnosis not present

## 2016-10-30 NOTE — Telephone Encounter (Signed)
Call patient she is to work on walking her fingers up and down the wall she may proceed with internal and external rotation strengthening of the shoulder and pulling back she can use a Thera-Band for the strengthening.

## 2016-10-30 NOTE — Telephone Encounter (Signed)
I called and spoke with patient and we will fax op note tomorrow.

## 2016-10-30 NOTE — Telephone Encounter (Signed)
No specific protocol just range of motion in all planes with strengthening of the shoulder.

## 2016-10-30 NOTE — Telephone Encounter (Signed)
Yes, bringing to you.

## 2016-10-31 NOTE — Telephone Encounter (Signed)
Faxed op note to [facility]

## 2016-11-01 DIAGNOSIS — M25512 Pain in left shoulder: Secondary | ICD-10-CM | POA: Diagnosis not present

## 2016-11-06 DIAGNOSIS — M25512 Pain in left shoulder: Secondary | ICD-10-CM | POA: Diagnosis not present

## 2016-11-07 ENCOUNTER — Encounter (INDEPENDENT_AMBULATORY_CARE_PROVIDER_SITE_OTHER): Payer: Self-pay | Admitting: Orthopedic Surgery

## 2016-11-07 ENCOUNTER — Ambulatory Visit (INDEPENDENT_AMBULATORY_CARE_PROVIDER_SITE_OTHER): Payer: BLUE CROSS/BLUE SHIELD | Admitting: Orthopedic Surgery

## 2016-11-07 VITALS — Ht 64.0 in | Wt 235.0 lb

## 2016-11-07 DIAGNOSIS — M75112 Incomplete rotator cuff tear or rupture of left shoulder, not specified as traumatic: Secondary | ICD-10-CM

## 2016-11-07 NOTE — Progress Notes (Signed)
   Office Visit Note   Patient: Allison Frost           Date of Birth: 08/31/1971           MRN: [id number] Visit Date: 11/07/2016              Requested by: Glenford Bayley, [place], [place] [address] PCP: Leotis Pain, DO  Chief Complaint  Patient presents with  . Left Shoulder - Routine Post Op    Shoulder arthroscopy with rotator cuff repair of partial tear 10/24/16. ~ 2 weeks post op.    HPI: Patient is a 46 y.o woman who presents today for follow up left shoulder arthroscopy with rotator cuff repair. She is approximately 2 weeks post op. She is doing well with left shoulder. She is doing physical therapy three times a week. Portals are clean and dry. There is minimal redness. She complains of pain right shoulder, and would like to discuss possible surgical intervention. She complains of allergic reaction to adhesive that was applied in surgery. She is currently just taking tylenol or ibuprofen for pain. Maxcine Ham, RT    Assessment & Plan: Visit Diagnoses:  1. Nontraumatic incomplete tear of left rotator cuff     Plan: Patient is quite pleased with her progress she will continue with her therapy [facility] but her for scar massage. Follow-up in 3 months patient would like to consider arthroscopic intervention for the right shoulder she states initially her right shoulder is more painful but the left shoulder caused her to increase her blood pressure.  Follow-Up Instructions: Return in about 3 months (around 02/04/2017).   Ortho Exam On examination patient is alert oriented no adenopathy she has full range of motion of both shoulders the portals are clean and dry the sutures are harvested there is no redness no cellulitis no signs of infection she does have a little bit of ecchymosis and bruising but there is no dermatitis or signs of allergic rash at this time. Patient feels like she may have developed a rash from the DuraPrep. Patient will continue with her physical  therapy for internal and external rotation strengthening and scapular stabilization these were reviewed with her.  Imaging: No results found.  Orders:  No orders of the defined types were placed in this encounter.  No orders of the defined types were placed in this encounter.    Procedures: No procedures performed  Clinical Data: No additional findings.  Subjective: Review of Systems  Objective: Vital Signs: Ht 5\' 4"  (1.626 m)   Wt 235 lb (106.6 kg)   BMI 40.34 kg/m   Specialty Comments:  No specialty comments available.  PMFS History: Patient Active Problem List   Diagnosis Date Noted  . Nontraumatic incomplete tear of left rotator cuff 10/19/2016  . Calf cramp 10/19/2016   Past Medical History:  Diagnosis Date  . Hypertension   . Rotator cuff tear     History reviewed. No pertinent family history.  Past Surgical History:  Procedure Laterality Date  . CHOLECYSTECTOMY     Social History   Occupational History  . Not on file.   Social History Main Topics  . Smoking status: Never Smoker  . Smokeless tobacco: Never Used  . Alcohol use No  . Drug use: No  . Sexual activity: Not on file

## 2016-11-08 DIAGNOSIS — F4320 Adjustment disorder, unspecified: Secondary | ICD-10-CM | POA: Diagnosis not present

## 2016-11-08 DIAGNOSIS — M25512 Pain in left shoulder: Secondary | ICD-10-CM | POA: Diagnosis not present

## 2016-11-13 DIAGNOSIS — M25512 Pain in left shoulder: Secondary | ICD-10-CM | POA: Diagnosis not present

## 2016-11-14 ENCOUNTER — Telehealth (INDEPENDENT_AMBULATORY_CARE_PROVIDER_SITE_OTHER): Payer: Self-pay | Admitting: Orthopedic Surgery

## 2016-11-14 NOTE — Telephone Encounter (Signed)
Patient called saying that the left shoulder she had surgery on 3 weeks ago, really started hurting last night. Wondering what she needs to do? Also is having muscle spasms in her legs. Cb # [phone]

## 2016-11-15 NOTE — Telephone Encounter (Signed)
appt tomorrow

## 2016-11-16 ENCOUNTER — Encounter (INDEPENDENT_AMBULATORY_CARE_PROVIDER_SITE_OTHER): Payer: Self-pay | Admitting: Orthopedic Surgery

## 2016-11-16 ENCOUNTER — Ambulatory Visit (INDEPENDENT_AMBULATORY_CARE_PROVIDER_SITE_OTHER): Payer: BLUE CROSS/BLUE SHIELD | Admitting: Orthopedic Surgery

## 2016-11-16 DIAGNOSIS — M75112 Incomplete rotator cuff tear or rupture of left shoulder, not specified as traumatic: Secondary | ICD-10-CM

## 2016-11-16 DIAGNOSIS — F4320 Adjustment disorder, unspecified: Secondary | ICD-10-CM | POA: Diagnosis not present

## 2016-11-16 NOTE — Progress Notes (Signed)
   Office Visit Note   Patient: Allison Frost           Date of Birth: 30-Aug-1971           MRN: [id number] Visit Date: 11/16/2016              Requested by: Glenford Bayley, [place], [place] [address] PCP: Leotis Pain, DO   Assessment & Plan: Visit Diagnoses:  1. Nontraumatic incomplete tear of left rotator cuff     Plan: Patient will back off on the internal rotation strengthening. She will use ibuprofen 600 mg 3 times a day as needed for pain she will continue with her physical therapy.  patient requested a rheumatologic screening of follow-up we will obtain blood work for rheumatologic screening at follow-up.  Follow-Up Instructions: Return in about 3 weeks (around 12/07/2016).   Orders:  No orders of the defined types were placed in this encounter.  No orders of the defined types were placed in this encounter.     Procedures: No procedures performed   Clinical Data: No additional findings.   Subjective: Chief Complaint  Patient presents with  . Left Shoulder - Pain, Follow-up    Patient comes in for a follow up on her left shoulder. She was just seen in our office on 11/07/16. She states she doesn't know what happened she just started having a lot of pain  . She is doing PT three times a week. Pain level varies. Today she is doing much better than before but still having pain. Certain things and movements bothers her. She uses the TENS Unit. She applies ice. She has been taking Ibuprofen prn.    Patient started developing acute pain with internal rotation during therapy  Review of Systems   Objective: Vital Signs: There were no vitals taken for this visit.  Physical Exam on examination patient has full range of motion left shoulder she now has pain with internal rotation.  Ortho Exam  Specialty Comments:  No specialty comments available.  Imaging: No results found.   PMFS History: Patient Active Problem List   Diagnosis Date Noted  .  Nontraumatic incomplete tear of left rotator cuff 10/19/2016  . Calf cramp 10/19/2016   Past Medical History:  Diagnosis Date  . Hypertension   . Rotator cuff tear     No family history on file.  Past Surgical History:  Procedure Laterality Date  . CHOLECYSTECTOMY     Social History   Occupational History  . Not on file.   Social History Main Topics  . Smoking status: Never Smoker  . Smokeless tobacco: Never Used  . Alcohol use No  . Drug use: No  . Sexual activity: Not on file

## 2016-11-17 ENCOUNTER — Telehealth (INDEPENDENT_AMBULATORY_CARE_PROVIDER_SITE_OTHER): Payer: Self-pay | Admitting: *Deleted

## 2016-11-17 NOTE — Telephone Encounter (Signed)
I called and left voicemail advised her I'm not sure what she spoke with Dr. Sharol Given about at her appointment. But he does typically recommend coconut water for cramping and spasms.

## 2016-11-17 NOTE — Telephone Encounter (Signed)
Pt called stating her muscle spasms were worse last night and is all over her body now. Pt stated she could not sleep and was asking for a call back. CB: [phone]

## 2016-11-20 DIAGNOSIS — F4320 Adjustment disorder, unspecified: Secondary | ICD-10-CM | POA: Diagnosis not present

## 2016-11-20 DIAGNOSIS — M25512 Pain in left shoulder: Secondary | ICD-10-CM | POA: Diagnosis not present

## 2016-11-27 DIAGNOSIS — M25512 Pain in left shoulder: Secondary | ICD-10-CM | POA: Diagnosis not present

## 2016-11-28 DIAGNOSIS — Z1329 Encounter for screening for other suspected endocrine disorder: Secondary | ICD-10-CM | POA: Diagnosis not present

## 2016-11-28 DIAGNOSIS — Z13220 Encounter for screening for lipoid disorders: Secondary | ICD-10-CM | POA: Diagnosis not present

## 2016-11-28 DIAGNOSIS — Z130 Encounter for screening for diseases of the blood and blood-forming organs and certain disorders involving the immune mechanism: Secondary | ICD-10-CM | POA: Diagnosis not present

## 2016-11-28 DIAGNOSIS — Z0000 Encounter for general adult medical examination without abnormal findings: Secondary | ICD-10-CM | POA: Diagnosis not present

## 2016-11-29 ENCOUNTER — Ambulatory Visit (INDEPENDENT_AMBULATORY_CARE_PROVIDER_SITE_OTHER): Payer: BLUE CROSS/BLUE SHIELD | Admitting: Family

## 2016-11-29 ENCOUNTER — Encounter (INDEPENDENT_AMBULATORY_CARE_PROVIDER_SITE_OTHER): Payer: Self-pay

## 2016-11-29 ENCOUNTER — Ambulatory Visit (INDEPENDENT_AMBULATORY_CARE_PROVIDER_SITE_OTHER): Payer: BLUE CROSS/BLUE SHIELD | Admitting: Orthopedic Surgery

## 2016-11-29 DIAGNOSIS — M75112 Incomplete rotator cuff tear or rupture of left shoulder, not specified as traumatic: Secondary | ICD-10-CM

## 2016-12-01 DIAGNOSIS — F4320 Adjustment disorder, unspecified: Secondary | ICD-10-CM | POA: Diagnosis not present

## 2016-12-01 NOTE — Progress Notes (Signed)
   Office Visit Note   Patient: Allison Frost           Date of Birth: 07-08-1971           MRN: [id number] Visit Date: 11/29/2016              Requested by: Glenford Bayley, [place], [place] [address] PCP: Leotis Pain, DO  Chief Complaint  Patient presents with  . Left Shoulder - Follow-up    HPI: The patient is a 46 year old woman who presents today status post left Shoulder arthroscopy with rotator cuff repair of partial tear 10/24/16. States is doing great. States is ready to return to work. No trouble with range of motion. Full range of motion is intact today. States is pain free.  Also states was seen by her primary care physician earlier this week and did have rheumatologic studies and lab work drawn.    Assessment & Plan: Visit Diagnoses:  1. Nontraumatic incomplete tear of left rotator cuff     Plan: Continue with home exercise program. Follow-up in office as needed. Provided a note today that she may return to work without restrictions.  Follow-Up Instructions: Return if symptoms worsen or fail to improve.   Left Shoulder Exam  Left shoulder exam is normal.  Tenderness  The patient is experiencing no tenderness.     Range of Motion  The patient has normal left shoulder ROM.  Muscle Strength  The patient has normal left shoulder strength.  Tests  Impingement: negative      Imaging: No results found.  Labs: No results found for: HGBA1C, ESRSEDRATE, CRP, LABURIC, REPTSTATUS, GRAMSTAIN, CULT, LABORGA  Orders:  No orders of the defined types were placed in this encounter.  No orders of the defined types were placed in this encounter.    Procedures: No procedures performed  Clinical Data: No additional findings.  Subjective: Review of Systems  Objective: Vital Signs: There were no vitals taken for this visit.  Specialty Comments:  No specialty comments available.  PMFS History: Patient Active Problem List   Diagnosis Date  Noted  . Nontraumatic incomplete tear of left rotator cuff 10/19/2016  . Calf cramp 10/19/2016   Past Medical History:  Diagnosis Date  . Hypertension   . Rotator cuff tear     No family history on file.  Past Surgical History:  Procedure Laterality Date  . CHOLECYSTECTOMY     Social History   Occupational History  . Not on file.   Social History Main Topics  . Smoking status: Never Smoker  . Smokeless tobacco: Never Used  . Alcohol use No  . Drug use: No  . Sexual activity: Not on file

## 2016-12-04 DIAGNOSIS — M25512 Pain in left shoulder: Secondary | ICD-10-CM | POA: Diagnosis not present

## 2016-12-07 ENCOUNTER — Ambulatory Visit (INDEPENDENT_AMBULATORY_CARE_PROVIDER_SITE_OTHER): Payer: BLUE CROSS/BLUE SHIELD | Admitting: Orthopedic Surgery

## 2016-12-11 DIAGNOSIS — M25512 Pain in left shoulder: Secondary | ICD-10-CM | POA: Diagnosis not present

## 2016-12-13 ENCOUNTER — Telehealth (INDEPENDENT_AMBULATORY_CARE_PROVIDER_SITE_OTHER): Payer: Self-pay | Admitting: Orthopedic Surgery

## 2016-12-13 DIAGNOSIS — M25512 Pain in left shoulder: Secondary | ICD-10-CM | POA: Diagnosis not present

## 2016-12-13 NOTE — Telephone Encounter (Signed)
Allison Frost would like to schedule Right shoulder surgery for some time in May or June.  If ok to schedule, can you please fill out a surgery sheet for her?

## 2016-12-14 NOTE — Telephone Encounter (Signed)
Blue sheet completed.

## 2016-12-28 DIAGNOSIS — Z1231 Encounter for screening mammogram for malignant neoplasm of breast: Secondary | ICD-10-CM | POA: Diagnosis not present

## 2016-12-29 DIAGNOSIS — M25512 Pain in left shoulder: Secondary | ICD-10-CM | POA: Diagnosis not present

## 2017-01-01 ENCOUNTER — Ambulatory Visit (INDEPENDENT_AMBULATORY_CARE_PROVIDER_SITE_OTHER): Payer: BLUE CROSS/BLUE SHIELD | Admitting: Family

## 2017-01-01 ENCOUNTER — Ambulatory Visit (INDEPENDENT_AMBULATORY_CARE_PROVIDER_SITE_OTHER): Payer: Self-pay

## 2017-01-01 ENCOUNTER — Telehealth (INDEPENDENT_AMBULATORY_CARE_PROVIDER_SITE_OTHER): Payer: Self-pay | Admitting: Orthopedic Surgery

## 2017-01-01 DIAGNOSIS — G8929 Other chronic pain: Secondary | ICD-10-CM

## 2017-01-01 DIAGNOSIS — M25561 Pain in right knee: Secondary | ICD-10-CM

## 2017-01-01 DIAGNOSIS — M1711 Unilateral primary osteoarthritis, right knee: Secondary | ICD-10-CM

## 2017-01-01 DIAGNOSIS — M25562 Pain in left knee: Secondary | ICD-10-CM

## 2017-01-01 MED ORDER — METHYLPREDNISOLONE ACETATE 40 MG/ML IJ SUSP
40.0000 mg | INTRAMUSCULAR | Status: AC | PRN
  Administered 2017-01-01: 40 mg via INTRA_ARTICULAR

## 2017-01-01 MED ORDER — LIDOCAINE HCL 1 % IJ SOLN
5.0000 mL | INTRAMUSCULAR | Status: AC | PRN
  Administered 2017-01-01: 5 mL

## 2017-01-01 NOTE — Telephone Encounter (Signed)
I spoke with Allison Frost in the office.  Surgery is scheduled for Tuesday 01/09/2017.

## 2017-01-01 NOTE — Telephone Encounter (Signed)
Per erin discussed with pt that she will discuss with Dr. Sharol Given tomorrow.

## 2017-01-01 NOTE — Progress Notes (Signed)
Office Visit Note   Patient: Allison Frost           Date of Birth: [birth year]           MRN: [id number] Visit Date: 01/01/2017              Requested by: Glenford Bayley, [place], [place] [address] PCP: Leotis Pain, DO  No chief complaint on file.     HPI: The patient is a 46 year old woman who presents today for evaluation of bilateral knee pain. Left worse than right. Complaining of global tenderness. States that the knee pops sometimes time feels like it pops out of socket. Complaining of similar symptoms on the right. However this knee grinds  All theTIME with ambulation and range of motion. States that she has discussed total knee replacements bilateral Dr. Sharol Given in the past and feels it may be coming up soon that she'll have to proceed with these due to intolerable pain.  Assessment & Plan: Visit Diagnoses:  1. Chronic pain of left knee   2. Chronic pain of right knee   3. Primary osteoarthritis of right knee     Plan: Injection left knee today. She'll follow-up in office as needed for knee pain. We will set her up for her shoulder scope this coming Tuesday.  Follow-Up Instructions: Return if symptoms worsen or fail to improve.   Right Knee Exam   Tenderness  The patient is experiencing tenderness in the lateral joint line and medial joint line.  Range of Motion  The patient has normal right knee ROM.  Tests  Varus: negative Valgus: negative  Other  Erythema: absent Swelling: none   Left Knee Exam   Tenderness  Left knee tenderness location: global, resists exam.  Range of Motion  The patient has normal left knee ROM.  Tests  Varus: negative Valgus: negative  Other  Erythema: absent Swelling: none      Patient is alert, oriented, no adenopathy, well-dressed, normal affect, normal respiratory effort.  Imaging: Xr Knee 1-2 Views Left  Result Date: 01/01/2017 Radiographs of the left knee show early joint space narrowing.  Xr  Knee 1-2 Views Right  Result Date: 01/01/2017 Radiographs of the right knee show moderate medial joint space narrowing. There is no osteophytic bone spurring.   Labs: No results found for: HGBA1C, ESRSEDRATE, CRP, LABURIC, REPTSTATUS, GRAMSTAIN, CULT, LABORGA  Orders:  Orders Placed This Encounter  Procedures  . Large Joint Injection/Arthrocentesis  . XR Knee 1-2 Views Right  . XR Knee 1-2 Views Left   No orders of the defined types were placed in this encounter.    Procedures: Large Joint Inj Date/Time: 01/01/2017 4:14 PM Performed by: Dondra Prader R Authorized by: Dondra Prader R   Consent Given by:  Patient Site marked: the procedure site was marked   Timeout: prior to procedure the correct patient, procedure, and site was verified   Indications:  Pain and diagnostic evaluation Location:  Knee Site:  L knee Needle Size:  22 G Needle Length:  1.5 inches Ultrasound Guidance: No   Fluoroscopic Guidance: No   Arthrogram: No   Medications:  5 mL lidocaine 1 %; 40 mg methylPREDNISolone acetate 40 MG/ML Aspiration Attempted: No   Patient tolerance:  Patient tolerated the procedure well with no immediate complications    Clinical Data: No additional findings.  ROS: Review of Systems  Constitutional: Negative for chills and fever.  Musculoskeletal: Positive for arthralgias and  joint swelling.    Objective: Vital Signs: There were no vitals taken for this visit.  Specialty Comments:  No specialty comments available.  PMFS History: Patient Active Problem List   Diagnosis Date Noted  . Nontraumatic incomplete tear of left rotator cuff 10/19/2016  . Calf cramp 10/19/2016   Past Medical History:  Diagnosis Date  . Hypertension   . Rotator cuff tear     No family history on file.  Past Surgical History:  Procedure Laterality Date  . CHOLECYSTECTOMY     Social History   Occupational History  . Not on file.   Social History Main Topics  . Smoking status:  Never Smoker  . Smokeless tobacco: Never Used  . Alcohol use No  . Drug use: No  . Sexual activity: Not on file

## 2017-01-01 NOTE — Telephone Encounter (Signed)
Pt requests phone call to discuss knee replacement.

## 2017-01-02 ENCOUNTER — Telehealth (INDEPENDENT_AMBULATORY_CARE_PROVIDER_SITE_OTHER): Payer: Self-pay | Admitting: *Deleted

## 2017-01-02 ENCOUNTER — Encounter (HOSPITAL_BASED_OUTPATIENT_CLINIC_OR_DEPARTMENT_OTHER): Payer: Self-pay | Admitting: *Deleted

## 2017-01-02 ENCOUNTER — Emergency Department (HOSPITAL_BASED_OUTPATIENT_CLINIC_OR_DEPARTMENT_OTHER)
Admission: EM | Admit: 2017-01-02 | Discharge: 2017-01-02 | Disposition: A | Discharge status: Home / Self Care | Payer: BLUE CROSS/BLUE SHIELD | Attending: Emergency Medicine | Admitting: Emergency Medicine

## 2017-01-02 DIAGNOSIS — Z79899 Other long term (current) drug therapy: Secondary | ICD-10-CM | POA: Insufficient documentation

## 2017-01-02 DIAGNOSIS — I10 Essential (primary) hypertension: Secondary | ICD-10-CM | POA: Insufficient documentation

## 2017-01-02 DIAGNOSIS — M25562 Pain in left knee: Secondary | ICD-10-CM

## 2017-01-02 HISTORY — DX: Other chronic pain: G89.29

## 2017-01-02 HISTORY — DX: Pain in left knee: M25.562

## 2017-01-02 HISTORY — DX: Pain in right knee: M25.561

## 2017-01-02 MED ORDER — ONDANSETRON 8 MG PO TBDP
8.0000 mg | ORAL_TABLET | Freq: Once | ORAL | Status: AC
  Administered 2017-01-02: 8 mg via ORAL
  Filled 2017-01-02: qty 1

## 2017-01-02 MED ORDER — HYDROMORPHONE HCL 1 MG/ML IJ SOLN
2.0000 mg | Freq: Once | INTRAMUSCULAR | Status: AC
  Administered 2017-01-02: 2 mg via INTRAMUSCULAR
  Filled 2017-01-02: qty 2

## 2017-01-02 NOTE — ED Notes (Signed)
Pt husband came back to ask for crutches for pt. EDP made aware, orders placed, crutches and crutch education given.

## 2017-01-02 NOTE — ED Notes (Signed)
EMT attempted to teach pt how to use crutches. Pt stated "She knows how to use them" and pt started to walk away from the EMT.

## 2017-01-02 NOTE — Telephone Encounter (Signed)
Patient called in this afternoon in regards to having some concerns about her recent visit to the ER. She also has some questions regarding her upcoming surgery on Tuesday April 24th. Her CB # [phone]. Thank you

## 2017-01-02 NOTE — ED Provider Notes (Signed)
[facility] Provider Note: [name], MD, FACEP  CSN: [id number] MRN: [id number] ARRIVAL: 01/02/17 at [place]: [place]  Knee Pain   Allison Frost  [name] is a 46 y.o. female with chronic bilateral knee pain. She was seen in Dr. Jess Barters office yesterday and had a steroid injection into her left knee performed by a nurse practitioner. She states that since the injection the pain in her knee has only worsened and she now describes the pain as severe and intolerable. The pain is improved when she keeps her knee slightly flexed and still. She states her knee has been swelling after the injection. The pain is made her nauseated. She is unable to use crutches because of chronic left shoulder pain. The pain has also given her a headache.She has been taking ibuprofen and using ice and elevation without relief.  Consultation with the [facility] state controlled substances database reveals the patient has received one prescription for oxycodone and one prescription for hydrocodone in the past year.   Past Medical History:  Diagnosis Date  . Chronic pain of both knees   . Hypertension   . Rotator cuff tear     Past Surgical History:  Procedure Laterality Date  . CHOLECYSTECTOMY      History reviewed. No pertinent family history.  Social History  Substance Use Topics  . Smoking status: Never Smoker  . Smokeless tobacco: Never Used  . Alcohol use No    Prior to Admission medications   Medication Sig Start Date End Date Taking? Authorizing Provider  baclofen (LIORESAL) 10 MG tablet  10/13/16   Historical Provider, MD  Cetirizine-Pseudoephedrine (ALLERGY D-12 PO) Take 1 tablet by mouth daily.    Historical Provider, MD  Cholecalciferol (VITAMIN D3) 2000 UNITS TABS Take 1 tablet by mouth daily.    Historical Provider, MD  diazepam (VALIUM) 2 MG tablet  10/12/16   Historical Provider, MD  diclofenac sodium (VOLTAREN) 1 % GEL   10/20/16   Historical Provider, MD  fish oil-omega-3 fatty acids 1000 MG capsule Take 2 g by mouth daily.    Historical Provider, MD  flurbiprofen (ANSAID) 100 MG tablet Take 100 mg by mouth 2 (two) times daily.    Historical Provider, MD  glucosamine-chondroitin 500-400 MG tablet Take 1 tablet by mouth 2 (two) times daily. Hold while in hospital    Historical Provider, MD  HYDROcodone-acetaminophen (NORCO/VICODIN) 5-325 MG tablet  10/24/16   Historical Provider, MD  meloxicam (MOBIC) 15 MG tablet  10/09/16   Historical Provider, MD  Multiple Vitamins-Minerals (MULTIVITAMINS THER. W/MINERALS) TABS Take 1 tablet by mouth daily.    Historical Provider, MD  naratriptan (AMERGE) 2.5 MG tablet Take 2.5 mg by mouth as needed. Take one (1) tablet at onset of headache; if returns or does not resolve, may repeat after 4 hours; do not exceed five (5) mg in 24 hours. For headache    Historical Provider, MD  ondansetron (ZOFRAN) 4 MG tablet  10/13/16   Historical Provider, MD  predniSONE (DELTASONE) 20 MG tablet  10/13/16   Historical Provider, MD  temazepam (RESTORIL) 30 MG capsule  10/20/16   Historical Provider, MD  traMADol Veatrice Bourbon) 50 MG tablet  10/16/16   Historical Provider, MD    Allergies Patient has no known allergies.   REVIEW OF SYSTEMS  Negative except as noted here or in the History of Present Illness.   PHYSICAL EXAMINATION  Initial Vital Signs Blood pressure Marland Kitchen)  179/112, pulse 74, temperature 98.6 F (37 C), temperature source Oral, resp. rate 20, height 5\' 4"  (1.626 m), weight 197 lb (89.4 kg), last menstrual period 12/26/2016, SpO2 100 %.  Examination General: Well-developed, well-nourished female in no acute distress; appearance consistent with age of record HENT: normocephalic; atraumatic Eyes: pupils equal, round and reactive to light; extraocular muscles intact Neck: supple Heart: regular rate and rhythm Lungs: clear to auscultation bilaterally Abdomen: soft; nondistended;  nontender; bowel sounds present Extremities: No deformity; pulses normal; decreased range of motion of left shoulder and bilateral knees; tenderness to palpation of left knee without appreciable effusion, warmth or erythema Neurologic: Awake, alert and oriented; motor function intact in all extremities and symmetric; no facial droop Skin: Warm and dry Psychiatric: Normal mood and affect   RESULTS  Summary of this visit's results, reviewed by myself:   EKG Interpretation  Date/Time:    Ventricular Rate:    PR Interval:    QRS Duration:   QT Interval:    QTC Calculation:   R Axis:     Text Interpretation:        Laboratory Studies: No results found for this or any previous visit (from the past 24 hour(s)). Imaging Studies: Xr Knee 1-2 Views Left  Result Date: 01/01/2017 Radiographs of the left knee show early joint space narrowing.  Xr Knee 1-2 Views Right  Result Date: 01/01/2017 Radiographs of the right knee show moderate medial joint space narrowing. There is no osteophytic bone spurring.   ED COURSE  Nursing notes and initial vitals signs, including pulse oximetry, reviewed.  Vitals:   01/02/17 0051  BP: (!) 179/112  Pulse: 74  Resp: 20  Temp: 98.6 F (37 C)  TempSrc: Oral  SpO2: 100%  Weight: 197 lb (89.4 kg)  Height: 5\' 4"  (1.626 m)   2:19 AM Patient had little relief with 2 milligrams Dilaudid IM. She relates that she has poor response to narcotics. We will attempt a knee immobilizer to place the knee in position of maximum comfort. She was advised to contact Dr. Jess Barters office later today.  PROCEDURES    ED DIAGNOSES     ICD-9-CM ICD-10-CM   1. Acute pain of left knee 719.46 M25.562        Allison Rosser, MD 01/02/17 [phone]

## 2017-01-02 NOTE — ED Triage Notes (Signed)
Pt c/o left knee pain after injection today at ortho

## 2017-01-02 NOTE — Telephone Encounter (Signed)
I called patient she states that the intra-articular knee injection caused her the most pain possible. She states that the IM that wanted shot as well as oral Zofran did not help her at all. She has been using a knee immobilizer. Recommend that she continue using ice she can use Tylenol not to use anti-inflammatories before surgery.

## 2017-01-02 NOTE — ED Notes (Signed)
Pt's visitor requested emesis bag. Pt given an emesis bag.

## 2017-01-02 NOTE — ED Notes (Signed)
Ice pack given

## 2017-01-03 DIAGNOSIS — F4320 Adjustment disorder, unspecified: Secondary | ICD-10-CM | POA: Diagnosis not present

## 2017-01-05 ENCOUNTER — Telehealth (INDEPENDENT_AMBULATORY_CARE_PROVIDER_SITE_OTHER): Payer: Self-pay | Admitting: *Deleted

## 2017-01-05 NOTE — Telephone Encounter (Signed)
Patient called in this afternoon in regards to her left knee. She is scheduled to have surgery on Tuesday with Dr. Sharol Given for her shoulder. She wanted to see if Dr. Sharol Given would maybe want to do a knee replacement? As well Her CB # [phone]. Thank you

## 2017-01-08 ENCOUNTER — Telehealth (INDEPENDENT_AMBULATORY_CARE_PROVIDER_SITE_OTHER): Payer: Self-pay | Admitting: Orthopedic Surgery

## 2017-01-08 NOTE — Telephone Encounter (Signed)
Patient called advised she went to the ER last week concerning her left knee after she had the injection. Patient said she left a message for Dr.  Sharol Given  Asking if he can do anything for her knees while she is having surgery on her shoulder tomorrow. The number to contact patient is  [phone]

## 2017-01-08 NOTE — Telephone Encounter (Signed)
Please see below.

## 2017-01-08 NOTE — Telephone Encounter (Signed)
I called and sw pt to advise of message below. She would like to have cortisone injections in both knees tomorrow per Dr. Sharol Given this is ok

## 2017-01-08 NOTE — Telephone Encounter (Signed)
Probably the best route is to correct the shoulder problem first so she can use her upper extremities after knee replacement surgery.

## 2017-01-08 NOTE — Telephone Encounter (Signed)
Call patient. We could inject her knee during her surgery

## 2017-01-08 NOTE — Telephone Encounter (Signed)
This is a duplicate message.

## 2017-01-09 ENCOUNTER — Encounter: Payer: Self-pay | Admitting: Orthopedic Surgery

## 2017-01-09 DIAGNOSIS — S43431A Superior glenoid labrum lesion of right shoulder, initial encounter: Secondary | ICD-10-CM | POA: Diagnosis not present

## 2017-01-09 DIAGNOSIS — M7551 Bursitis of right shoulder: Secondary | ICD-10-CM | POA: Diagnosis not present

## 2017-01-09 DIAGNOSIS — G8918 Other acute postprocedural pain: Secondary | ICD-10-CM | POA: Diagnosis not present

## 2017-01-09 DIAGNOSIS — M75121 Complete rotator cuff tear or rupture of right shoulder, not specified as traumatic: Secondary | ICD-10-CM | POA: Diagnosis not present

## 2017-01-09 DIAGNOSIS — M75111 Incomplete rotator cuff tear or rupture of right shoulder, not specified as traumatic: Secondary | ICD-10-CM | POA: Diagnosis not present

## 2017-01-09 DIAGNOSIS — M7541 Impingement syndrome of right shoulder: Secondary | ICD-10-CM | POA: Diagnosis not present

## 2017-01-12 ENCOUNTER — Telehealth (INDEPENDENT_AMBULATORY_CARE_PROVIDER_SITE_OTHER): Payer: Self-pay | Admitting: Radiology

## 2017-01-12 NOTE — Telephone Encounter (Signed)
Please advise 

## 2017-01-12 NOTE — Telephone Encounter (Signed)
I called and advised patient----I called rx to [facility]---I did advise that if symptoms worsen to call the on call Dr. This weekend.

## 2017-01-12 NOTE — Telephone Encounter (Signed)
OK to call in # [phone]   Tylenol # 3     One po tid prn pain. Stop norco. thanks

## 2017-01-12 NOTE — Telephone Encounter (Signed)
Dr. Sharol Given patient--Patient had right shoulder surgery with Dr. Sharol Given on 01/09/2017 and states that today she is having a pulling sensation in her shoulders. She states that this started yesterday.  She has been doing hte pendulum and wall walks with her hand and she notice with that.  She also whats to see if we can change her Hydrocodone 5-325mg  to something else. She says it is messing with her head by giving her headaches.  Please advise

## 2017-01-12 NOTE — Telephone Encounter (Signed)
Please call.

## 2017-01-15 ENCOUNTER — Telehealth (INDEPENDENT_AMBULATORY_CARE_PROVIDER_SITE_OTHER): Payer: Self-pay | Admitting: *Deleted

## 2017-01-15 ENCOUNTER — Ambulatory Visit (INDEPENDENT_AMBULATORY_CARE_PROVIDER_SITE_OTHER): Payer: BLUE CROSS/BLUE SHIELD | Admitting: Orthopedic Surgery

## 2017-01-15 ENCOUNTER — Encounter (INDEPENDENT_AMBULATORY_CARE_PROVIDER_SITE_OTHER): Payer: Self-pay | Admitting: Orthopedic Surgery

## 2017-01-15 DIAGNOSIS — M75112 Incomplete rotator cuff tear or rupture of left shoulder, not specified as traumatic: Secondary | ICD-10-CM

## 2017-01-15 NOTE — Telephone Encounter (Signed)
Pt called stating she just had surgery last Tuesday and had called last week stating she was feeling some pulling and numbness in arm and was told to take tylenol # 3 and if got worse to call and will work in. Pt called today stating she is not getting any better and feels like a muscle is pulling and still having numbness. Pt is scheduled to come in today with MD for evaluation.

## 2017-01-15 NOTE — Progress Notes (Signed)
   Office Visit Note   Patient: Allison Frost           Date of Birth: 1971/06/22           MRN: [id number] Visit Date: 01/15/2017              Requested by: Glenford Bayley, [place], [place] [address] PCP: Leotis Pain, DO  Chief Complaint  Patient presents with  . Right Shoulder - Routine Post Op      HPI: Patient presents status post right shoulder arthroscopy is doing her exercises she states she's can start physical therapy next week. She is using the physical therapy exercises from her left shoulder. She complains of some neck muscle tightness. She has some crepitation range of motion of her knees. She states that she is having some constipation secondary to her narcotics.  Assessment & Plan: Visit Diagnoses:  1. Nontraumatic incomplete tear of left rotator cuff     Plan: Recommend continue with the range of motion of the shoulder we will harvest the sutures today recommended a heating pad for her neck to help with the muscle spasm which is quite common with shoulder arthroscopy recommend that she continue using ibuprofen 800 mg 3 times a day instead of the narcotics. And recommended magnesium citrate for a laxative.  Follow-Up Instructions: Return in about 3 weeks (around 02/05/2017).   Ortho Exam  Patient is alert, oriented, no adenopathy, well-dressed, normal affect, normal respiratory effort. Examination the portals are clean and dry she has good passive range of motion of the right shoulder. There is crepitation range of motion of her knees but no swelling no cellulitis.  Imaging: No results found.  Labs: No results found for: HGBA1C, ESRSEDRATE, CRP, LABURIC, REPTSTATUS, GRAMSTAIN, CULT, LABORGA  Orders:  No orders of the defined types were placed in this encounter.  No orders of the defined types were placed in this encounter.    Procedures: No procedures performed  Clinical Data: No additional findings.  ROS:  All other systems negative,  except as noted in the HPI. Review of Systems  Objective: Vital Signs: LMP 12/26/2016   Specialty Comments:  No specialty comments available.  PMFS History: Patient Active Problem List   Diagnosis Date Noted  . Nontraumatic incomplete tear of left rotator cuff 10/19/2016  . Calf cramp 10/19/2016   Past Medical History:  Diagnosis Date  . Chronic pain of both knees   . Hypertension   . Rotator cuff tear     No family history on file.  Past Surgical History:  Procedure Laterality Date  . CHOLECYSTECTOMY     Social History   Occupational History  . Not on file.   Social History Main Topics  . Smoking status: Never Smoker  . Smokeless tobacco: Never Used  . Alcohol use No  . Drug use: No  . Sexual activity: Not on file

## 2017-01-16 ENCOUNTER — Telehealth (INDEPENDENT_AMBULATORY_CARE_PROVIDER_SITE_OTHER): Payer: Self-pay | Admitting: Orthopedic Surgery

## 2017-01-16 ENCOUNTER — Other Ambulatory Visit (INDEPENDENT_AMBULATORY_CARE_PROVIDER_SITE_OTHER): Payer: Self-pay

## 2017-01-16 DIAGNOSIS — M25512 Pain in left shoulder: Secondary | ICD-10-CM

## 2017-01-16 DIAGNOSIS — M25561 Pain in right knee: Principal | ICD-10-CM

## 2017-01-16 DIAGNOSIS — M25562 Pain in left knee: Principal | ICD-10-CM

## 2017-01-16 DIAGNOSIS — G8929 Other chronic pain: Secondary | ICD-10-CM

## 2017-01-16 NOTE — Telephone Encounter (Signed)
Allison Frost with [facility] called advised patient was referred to them  for tomorrow. Patient was referred for (PT) Patient said Dr Sharol Given did some surgery on her shoulder. Karna Christmas is requesting Rx scrip, Op report, Protocol and also patient said she need to work on her knee.   The number to contact Karna Christmas is [phone]. The fax# is [phone]

## 2017-01-16 NOTE — Telephone Encounter (Signed)
Order faxed with needed information.

## 2017-01-17 DIAGNOSIS — M25561 Pain in right knee: Secondary | ICD-10-CM | POA: Diagnosis not present

## 2017-01-17 DIAGNOSIS — M25511 Pain in right shoulder: Secondary | ICD-10-CM | POA: Diagnosis not present

## 2017-01-19 ENCOUNTER — Encounter (HOSPITAL_BASED_OUTPATIENT_CLINIC_OR_DEPARTMENT_OTHER): Payer: Self-pay

## 2017-01-19 ENCOUNTER — Emergency Department (HOSPITAL_BASED_OUTPATIENT_CLINIC_OR_DEPARTMENT_OTHER)
Admission: EM | Admit: 2017-01-19 | Discharge: 2017-01-20 | Disposition: A | Discharge status: Home / Self Care | Payer: BLUE CROSS/BLUE SHIELD | Attending: Emergency Medicine | Admitting: Emergency Medicine

## 2017-01-19 ENCOUNTER — Emergency Department (HOSPITAL_BASED_OUTPATIENT_CLINIC_OR_DEPARTMENT_OTHER): Payer: BLUE CROSS/BLUE SHIELD

## 2017-01-19 DIAGNOSIS — I10 Essential (primary) hypertension: Secondary | ICD-10-CM | POA: Insufficient documentation

## 2017-01-19 DIAGNOSIS — K5732 Diverticulitis of large intestine without perforation or abscess without bleeding: Secondary | ICD-10-CM | POA: Diagnosis not present

## 2017-01-19 DIAGNOSIS — R109 Unspecified abdominal pain: Secondary | ICD-10-CM | POA: Diagnosis present

## 2017-01-19 DIAGNOSIS — R140 Abdominal distension (gaseous): Secondary | ICD-10-CM | POA: Diagnosis not present

## 2017-01-19 HISTORY — DX: Diverticulitis of intestine, part unspecified, without perforation or abscess without bleeding: K57.92

## 2017-01-19 LAB — CBC
HCT: 37.6 % (ref 36.0–46.0)
HEMOGLOBIN: 12.4 g/dL (ref 12.0–15.0)
MCH: 29.9 pg (ref 26.0–34.0)
MCHC: 33 g/dL (ref 30.0–36.0)
MCV: 90.6 fL (ref 78.0–100.0)
Platelets: 272 10*3/uL (ref 150–400)
RBC: 4.15 MIL/uL (ref 3.87–5.11)
RDW: 13.2 % (ref 11.5–15.5)
WBC: 11.6 10*3/uL — ABNORMAL HIGH (ref 4.0–10.5)

## 2017-01-19 LAB — URINALYSIS, ROUTINE W REFLEX MICROSCOPIC
BILIRUBIN URINE: NEGATIVE
GLUCOSE, UA: NEGATIVE mg/dL
HGB URINE DIPSTICK: NEGATIVE
KETONES UR: NEGATIVE mg/dL
Leukocytes, UA: NEGATIVE
Nitrite: NEGATIVE
Protein, ur: NEGATIVE mg/dL
SPECIFIC GRAVITY, URINE: 1.021 (ref 1.005–1.030)
pH: 7.5 (ref 5.0–8.0)

## 2017-01-19 LAB — COMPREHENSIVE METABOLIC PANEL
ALBUMIN: 3.3 g/dL — AB (ref 3.5–5.0)
ALK PHOS: 86 U/L (ref 38–126)
ALT: 17 U/L (ref 14–54)
ANION GAP: 6 (ref 5–15)
AST: 24 U/L (ref 15–41)
BUN: 13 mg/dL (ref 6–20)
CALCIUM: 8.6 mg/dL — AB (ref 8.9–10.3)
CO2: 27 mmol/L (ref 22–32)
Chloride: 105 mmol/L (ref 101–111)
Creatinine, Ser: 0.56 mg/dL (ref 0.44–1.00)
GFR calc non Af Amer: >60 mL/min (ref >60)
GLUCOSE: 102 mg/dL — AB (ref 65–99)
Potassium: 3.6 mmol/L (ref 3.5–5.1)
Sodium: 138 mmol/L (ref 135–145)
Total Bilirubin: 0.3 mg/dL (ref 0.3–1.2)
Total Protein: 6.7 g/dL (ref 6.5–8.1)

## 2017-01-19 LAB — PREGNANCY, URINE: Preg Test, Ur: NEGATIVE

## 2017-01-19 LAB — LIPASE, BLOOD: Lipase: 34 U/L (ref 11–51)

## 2017-01-19 MED ORDER — ONDANSETRON HCL 4 MG/2ML IJ SOLN
4.0000 mg | Freq: Once | INTRAMUSCULAR | Status: AC | PRN
  Administered 2017-01-19: 4 mg via INTRAVENOUS
  Filled 2017-01-19: qty 2

## 2017-01-19 NOTE — ED Triage Notes (Signed)
Pt had surgery a week ago, has had intermittent constipation, bloating and nausea with pain on the left side of abdomen that started yesterday.  Pt denies vomiting, denies fever, has tried multiple OTC meds without success

## 2017-01-19 NOTE — ED Provider Notes (Signed)
[place] DEPT [facility] Provider Note: Allison Spurling, MD, FACEP  By signing my name below, I, Allison Frost and Allison Frost, attest that this documentation has been prepared under the direction and in the presence of Allison Lieurance, MD . Electronically Signed: Gaspar Frost and Allison Frost, ED Scribe. 01/19/2017. 11:42 PM   CSN: [id number] MRN: [id number] ARRIVAL: 01/19/17 at 2226 ROOM: [place]  Abdominal Pain   HISTORY OF PRESENT ILLNESS  Allison Frost is a 46 y.o. female s/p right shoudler surgery on April 24th, 2018, who presents to the Emergency Department complaining of moderate to severe left flank pain that began yesterday. Pt rates pain at 7/10 at rest and 9/10 when it gets "really nasty" (notably when moving around). Pt has a h/o diverticulitis and notes her current pain is in the same area and of the same quality. She is not having significant pain associated with her shoulder although there is some continued pain with movement.  She reports abdominal distention and constipation since surgery, which she attributes to the hydrocodone she was prescribed after the surgery. Pt has taken Miralax, probiotics, and fiber supplements without relief of constipation or bloating. Her last bowel movement was yesterday, which was small, soft and little. She has not been vomiting but has had nausea.    Past Medical History:  Diagnosis Date  . Chronic pain of both knees   . Diverticulitis   . Hypertension   . Rotator cuff tear     Past Surgical History:  Procedure Laterality Date  . CHOLECYSTECTOMY      No family history on file.  Social History  Substance Use Topics  . Smoking status: Never Smoker  . Smokeless tobacco: Never Used  . Alcohol use No    Prior to Admission medications   Medication Sig Start Date End Date Taking? Authorizing Provider  acetaminophen-codeine (TYLENOL #3) 300-30 MG tablet  01/12/17   [provider]  baclofen (LIORESAL) 10  MG tablet  10/13/16   [provider]  Cetirizine-Pseudoephedrine (ALLERGY D-12 PO) Take 1 tablet by mouth daily.    [provider]  Cholecalciferol (VITAMIN D3) 2000 UNITS TABS Take 1 tablet by mouth daily.    [provider]  diazepam (VALIUM) 2 MG tablet  10/12/16   [provider]  diclofenac sodium (VOLTAREN) 1 % GEL  10/20/16   [provider]  fish oil-omega-3 fatty acids 1000 MG capsule Take 2 g by mouth daily.    [provider]  flurbiprofen (ANSAID) 100 MG tablet Take 100 mg by mouth 2 (two) times daily.    [provider]  glucosamine-chondroitin 500-400 MG tablet Take 1 tablet by mouth 2 (two) times daily. Hold while in hospital    [provider]  HYDROcodone-acetaminophen (NORCO/VICODIN) 5-325 MG tablet  10/24/16   [provider]  lisinopril (PRINIVIL,ZESTRIL) 10 MG tablet  01/08/17   [provider]  meloxicam (MOBIC) 15 MG tablet  10/09/16   [provider]  Multiple Vitamins-Minerals (MULTIVITAMINS THER. W/MINERALS) TABS Take 1 tablet by mouth daily.    [provider]  naratriptan (AMERGE) 2.5 MG tablet Take 2.5 mg by mouth as needed. Take one (1) tablet at onset of headache; if returns or does not resolve, may repeat after 4 hours; do not exceed five (5) mg in 24 hours. For headache    [provider]  ondansetron (ZOFRAN) 4 MG tablet  10/13/16   [provider]  predniSONE (DELTASONE)  20 MG tablet  10/13/16   [provider]  promethazine (PHENERGAN) 25 MG tablet  12/01/16   [provider]  temazepam (RESTORIL) 30 MG capsule  10/20/16   [provider]  traMADol Veatrice Bourbon) 50 MG tablet  10/16/16   [provider]  TRANSDERM-SCOP, 1.5 MG, 1 MG/3DAYS  12/14/16   [provider]  VENTOLIN HFA 108 (90 Base) MCG/ACT inhaler  01/08/17   [provider]    Allergies Imitrex [sumatriptan] and Latex   REVIEW OF  SYSTEMS  Negative except as noted here or in the History of Present Illness.   PHYSICAL EXAMINATION  Initial Vital Signs Blood pressure (!) 166/101, pulse 70, temperature 98.2 F (36.8 C), temperature source Oral, resp. rate 18, height 5\' 4"  (1.626 m), weight 197 lb (89.4 kg), last menstrual period 12/26/2016, SpO2 100 %.  Examination General: Well-developed, well-nourished female in no acute distress; appearance consistent with age of record HENT: normocephalic; atraumatic Eyes: pupils equal, round and reactive to light; extraocular muscles intact Neck: supple Heart: regular rate and rhythm Lungs: clear to auscultation bilaterally Abdomen: soft; mildly distended; left lateral tenderness; no masses or hepatosplenomegaly; bowel sounds hypoactive Extremities: No deformity; decreased range of motion s/p shoulder surgery; pulses normal Neurologic: Awake, alert and oriented; motor function intact in all extremities and symmetric; no facial droop Skin: Warm and dry Psychiatric: Normal mood and affect   RESULTS  Summary of this visit's results, reviewed by myself:   EKG Interpretation  Date/Time:    Ventricular Rate:    PR Interval:    QRS Duration:   QT Interval:    QTC Calculation:   R Axis:     Text Interpretation:        Laboratory Studies: Results for orders placed or performed during the hospital encounter of 01/19/17 (from the past 24 hour(s))  Urinalysis, Routine w reflex microscopic     Status: Abnormal   Collection Time: 01/19/17 11:12 PM  Result Value Ref Range   Color, Urine YELLOW YELLOW   APPearance CLOUDY (A) CLEAR   Specific Gravity, Urine 1.021 1.005 - 1.030   pH 7.5 5.0 - 8.0   Glucose, UA NEGATIVE NEGATIVE mg/dL   Hgb urine dipstick NEGATIVE NEGATIVE   Bilirubin Urine NEGATIVE NEGATIVE   Ketones, ur NEGATIVE NEGATIVE mg/dL   Protein, ur NEGATIVE NEGATIVE mg/dL   Nitrite NEGATIVE NEGATIVE   Leukocytes, UA NEGATIVE NEGATIVE  Pregnancy, urine      Status: None   Collection Time: 01/19/17 11:12 PM  Result Value Ref Range   Preg Test, Ur NEGATIVE NEGATIVE  Lipase, blood     Status: None   Collection Time: 01/19/17 11:31 PM  Result Value Ref Range   Lipase 34 11 - 51 U/L  Comprehensive metabolic panel     Status: Abnormal   Collection Time: 01/19/17 11:31 PM  Result Value Ref Range   Sodium 138 135 - 145 mmol/L   Potassium 3.6 3.5 - 5.1 mmol/L   Chloride 105 101 - 111 mmol/L   CO2 27 22 - 32 mmol/L   Glucose, Bld 102 (H) 65 - 99 mg/dL   BUN 13 6 - 20 mg/dL   Creatinine, Ser 0.56 0.44 - 1.00 mg/dL   Calcium 8.6 (L) 8.9 - 10.3 mg/dL   Total Protein 6.7 6.5 - 8.1 g/dL   Albumin 3.3 (L) 3.5 - 5.0 g/dL   AST 24 15 - 41 U/L   ALT 17 14 - 54 U/L   Alkaline Phosphatase  86 38 - 126 U/L   Total Bilirubin 0.3 0.3 - 1.2 mg/dL   GFR calc non Af Amer >60 >60 mL/min   GFR calc Af Amer >60 >60 mL/min   Anion gap 6 5 - 15  CBC     Status: Abnormal   Collection Time: 01/19/17 11:31 PM  Result Value Ref Range   WBC 11.6 (H) 4.0 - 10.5 K/uL   RBC 4.15 3.87 - 5.11 MIL/uL   Hemoglobin 12.4 12.0 - 15.0 g/dL   HCT 37.6 36.0 - 46.0 %   MCV 90.6 78.0 - 100.0 fL   MCH 29.9 26.0 - 34.0 pg   MCHC 33.0 30.0 - 36.0 g/dL   RDW 13.2 11.5 - 15.5 %   Platelets 272 150 - 400 K/uL   Imaging Studies: Ct Abdomen Pelvis W Contrast  Result Date: 01/20/2017 CLINICAL DATA:  Intermittent constipation bloating and nausea with pain on the left side of the abdomen EXAM: CT ABDOMEN AND PELVIS WITH CONTRAST TECHNIQUE: Multidetector CT imaging of the abdomen and pelvis was performed using the standard protocol following bolus administration of intravenous contrast. CONTRAST:  168mL ISOVUE-300 IOPAMIDOL (ISOVUE-300) INJECTION 61% COMPARISON:  10/29/2015 FINDINGS: Lower chest: No acute consolidation or pleural effusion at the bilateral lung bases. The heart is nonenlarged. Hepatobiliary: Subtle area of hyperdensity within the right hepatic lobe, stable compared to  previous and suspected to represent small flash hemangioma. Surgical clips in the gallbladder fossa. No biliary dilatation. Pancreas: Unremarkable. No pancreatic ductal dilatation or surrounding inflammatory changes. Spleen: Normal in size without focal abnormality. Adrenals/Urinary Tract: Adrenal glands are unremarkable. Kidneys are normal, without renal calculi, focal lesion, or hydronephrosis. Bladder is unremarkable. Stomach/Bowel: The stomach is nonenlarged. No dilated small bowel. Stool-filled colon. There is mild focal wall thickening and surrounding inflammation at the junction of the distal descending and sigmoid colon with a few small diverticula present. Normal appendix. Vascular/Lymphatic: No significant vascular findings are present. No enlarged abdominal or pelvic lymph nodes. Reproductive: Uterus.  3.3 cm probable cyst in the left adnexa. Other: Fat containing periumbilical hernia. No free air or free fluid. Musculoskeletal: No acute or significant osseous findings. IMPRESSION: 1. Mild focal wall thickening and inflammatory change at the junction of the descending and sigmoid colon with a few diverticula noted in the region, suspect that the findings are secondary to an acute diverticulitis with focal colitis felt less likely. No perforation or abscess. 2. 3.3 cm probable cyst in the left adnexa. 6-12 week ultrasound follow-up is suggested. 3. Stable hyperdense focus in the right hepatic lobe, likely representing a small hemangioma Electronically Signed   By: Donavan Foil M.D.   On: 01/20/2017 02:05    ED COURSE  Nursing notes and initial vitals signs, including pulse oximetry, reviewed.  Vitals:   01/19/17 2232 01/20/17 0141  BP: (!) 166/101 (!) 149/87  Pulse: 70 62  Resp: 18 16  Temp: 98.2 F (36.8 C)   TempSrc: Oral   SpO2: 100% 100%  Weight: 197 lb (89.4 kg)   Height: 5\' 4"  (1.626 m)     PROCEDURES    ED DIAGNOSES     ICD-9-CM ICD-10-CM   1. Diverticulitis of large  intestine without perforation or abscess without bleeding 562.11 K57.32     I personally performed the services described in this documentation, which was scribed in my presence. The recorded information has been reviewed and is accurate.     Keyshawn Hellwig, Jenny Reichmann, MD 01/20/17 [phone]

## 2017-01-20 ENCOUNTER — Telehealth (HOSPITAL_BASED_OUTPATIENT_CLINIC_OR_DEPARTMENT_OTHER): Payer: Self-pay | Admitting: *Deleted

## 2017-01-20 ENCOUNTER — Encounter (HOSPITAL_BASED_OUTPATIENT_CLINIC_OR_DEPARTMENT_OTHER): Payer: Self-pay | Admitting: Emergency Medicine

## 2017-01-20 DIAGNOSIS — R140 Abdominal distension (gaseous): Secondary | ICD-10-CM | POA: Diagnosis not present

## 2017-01-20 MED ORDER — CIPROFLOXACIN HCL 500 MG PO TABS: 500.0000 mg | ORAL_TABLET | Freq: Two times a day (BID) | ORAL | 0 refills | Status: DC

## 2017-01-20 MED ORDER — IOPAMIDOL (ISOVUE-300) INJECTION 61%
100.0000 mL | Freq: Once | INTRAVENOUS | Status: AC | PRN
  Administered 2017-01-20: 100 mL via INTRAVENOUS

## 2017-01-20 MED ORDER — PROMETHAZINE HCL 25 MG PO TABS: 25.0000 mg | ORAL_TABLET | Freq: Four times a day (QID) | ORAL | 0 refills | Status: DC | PRN

## 2017-01-20 MED ORDER — METRONIDAZOLE 500 MG PO TABS: 500.0000 mg | ORAL_TABLET | Freq: Three times a day (TID) | ORAL | 0 refills | Status: DC

## 2017-01-20 NOTE — ED Notes (Signed)
Patient transported to CT 

## 2017-01-22 DIAGNOSIS — M25511 Pain in right shoulder: Secondary | ICD-10-CM | POA: Diagnosis not present

## 2017-01-22 DIAGNOSIS — M25561 Pain in right knee: Secondary | ICD-10-CM | POA: Diagnosis not present

## 2017-01-22 DIAGNOSIS — Z111 Encounter for screening for respiratory tuberculosis: Secondary | ICD-10-CM | POA: Diagnosis not present

## 2017-01-23 ENCOUNTER — Inpatient Hospital Stay (INDEPENDENT_AMBULATORY_CARE_PROVIDER_SITE_OTHER): Payer: BLUE CROSS/BLUE SHIELD | Admitting: Orthopedic Surgery

## 2017-01-24 DIAGNOSIS — N760 Acute vaginitis: Secondary | ICD-10-CM | POA: Diagnosis not present

## 2017-01-26 DIAGNOSIS — M25511 Pain in right shoulder: Secondary | ICD-10-CM | POA: Diagnosis not present

## 2017-01-26 DIAGNOSIS — M25561 Pain in right knee: Secondary | ICD-10-CM | POA: Diagnosis not present

## 2017-01-29 DIAGNOSIS — K5732 Diverticulitis of large intestine without perforation or abscess without bleeding: Secondary | ICD-10-CM | POA: Diagnosis not present

## 2017-01-29 DIAGNOSIS — M25511 Pain in right shoulder: Secondary | ICD-10-CM | POA: Diagnosis not present

## 2017-01-29 DIAGNOSIS — N949 Unspecified condition associated with female genital organs and menstrual cycle: Secondary | ICD-10-CM | POA: Diagnosis not present

## 2017-01-29 DIAGNOSIS — M25561 Pain in right knee: Secondary | ICD-10-CM | POA: Diagnosis not present

## 2017-01-29 DIAGNOSIS — R102 Pelvic and perineal pain: Secondary | ICD-10-CM | POA: Diagnosis not present

## 2017-01-29 DIAGNOSIS — K5909 Other constipation: Secondary | ICD-10-CM | POA: Diagnosis not present

## 2017-01-31 ENCOUNTER — Ambulatory Visit (HOSPITAL_BASED_OUTPATIENT_CLINIC_OR_DEPARTMENT_OTHER)
Admission: RE | Admit: 2017-01-31 | Discharge: 2017-01-31 | Disposition: A | Discharge status: Home / Self Care | Payer: BLUE CROSS/BLUE SHIELD | Source: Ambulatory Visit | Attending: Family Medicine | Admitting: Family Medicine

## 2017-01-31 ENCOUNTER — Other Ambulatory Visit: Payer: Self-pay | Admitting: Family Medicine

## 2017-01-31 ENCOUNTER — Encounter (HOSPITAL_BASED_OUTPATIENT_CLINIC_OR_DEPARTMENT_OTHER): Payer: Self-pay

## 2017-01-31 DIAGNOSIS — N949 Unspecified condition associated with female genital organs and menstrual cycle: Secondary | ICD-10-CM | POA: Diagnosis not present

## 2017-01-31 DIAGNOSIS — N83202 Unspecified ovarian cyst, left side: Secondary | ICD-10-CM | POA: Insufficient documentation

## 2017-01-31 DIAGNOSIS — M25561 Pain in right knee: Secondary | ICD-10-CM | POA: Diagnosis not present

## 2017-01-31 DIAGNOSIS — N83201 Unspecified ovarian cyst, right side: Secondary | ICD-10-CM | POA: Insufficient documentation

## 2017-01-31 DIAGNOSIS — M25511 Pain in right shoulder: Secondary | ICD-10-CM | POA: Diagnosis not present

## 2017-01-31 DIAGNOSIS — D259 Leiomyoma of uterus, unspecified: Secondary | ICD-10-CM | POA: Insufficient documentation

## 2017-01-31 DIAGNOSIS — N8302 Follicular cyst of left ovary: Secondary | ICD-10-CM | POA: Diagnosis not present

## 2017-01-31 DIAGNOSIS — N8301 Follicular cyst of right ovary: Secondary | ICD-10-CM | POA: Diagnosis not present

## 2017-01-31 HISTORY — DX: Polycystic ovarian syndrome: E28.2

## 2017-02-02 DIAGNOSIS — M25511 Pain in right shoulder: Secondary | ICD-10-CM | POA: Diagnosis not present

## 2017-02-02 DIAGNOSIS — M25561 Pain in right knee: Secondary | ICD-10-CM | POA: Diagnosis not present

## 2017-02-05 ENCOUNTER — Ambulatory Visit (INDEPENDENT_AMBULATORY_CARE_PROVIDER_SITE_OTHER): Payer: BLUE CROSS/BLUE SHIELD | Admitting: Orthopedic Surgery

## 2017-02-05 ENCOUNTER — Encounter (INDEPENDENT_AMBULATORY_CARE_PROVIDER_SITE_OTHER): Payer: Self-pay | Admitting: Orthopedic Surgery

## 2017-02-05 ENCOUNTER — Telehealth (INDEPENDENT_AMBULATORY_CARE_PROVIDER_SITE_OTHER): Payer: Self-pay

## 2017-02-05 VITALS — Ht 64.0 in | Wt 197.0 lb

## 2017-02-05 DIAGNOSIS — M25562 Pain in left knee: Secondary | ICD-10-CM

## 2017-02-05 DIAGNOSIS — M75112 Incomplete rotator cuff tear or rupture of left shoulder, not specified as traumatic: Secondary | ICD-10-CM

## 2017-02-05 DIAGNOSIS — G8929 Other chronic pain: Secondary | ICD-10-CM

## 2017-02-05 NOTE — Telephone Encounter (Signed)
Benefit verification sent to [facility] for injection for left knee. Pt is a [employer] member but she has tried and failed euflexxa in 02/2011 so we can try for prior auth on non preferred medication. I will hold this message in my box pending authorization.

## 2017-02-05 NOTE — Progress Notes (Signed)
   Office Visit Note   Patient: Allison Frost           Date of Birth: 05-Sep-1971           MRN: [id number] Visit Date: 02/05/2017              Requested by: Glenford Bayley, DO [place], [place] [address] PCP: Glenford Bayley, DO  Chief Complaint  Patient presents with  . Left Shoulder - Follow-up    Rotator cuff repair  . Right Shoulder - Follow-up    Follow up right shoulder arthroscopy      HPI: Patient is status post bilateral shoulder arthroscopies she states the right shoulder has done quite well she is taking longer to recover on the left she states she is one more visit to be completed with her physical therapy she is very pleased with her progress. She also complains of chronic pain in both knees left worse than right with elbow relief with previous steroid injections.  Assessment & Plan: Visit Diagnoses:  1. Nontraumatic incomplete tear of left rotator cuff   2. Chronic pain of left knee     Plan: We will have her continue with her therapy and once completed continue to do the exercises on her own. We'll request authorization for hyaluronic acid injection for her left knee.  Follow-Up Instructions: Return if symptoms worsen or fail to improve.   Ortho Exam  Patient is alert, oriented, no adenopathy, well-dressed, normal affect, normal respiratory effort. Examination she has no effusion of either knee she has pain and crepitation with range of motion she states that her left knee felt like it popped out the other day. Examination she is showing good improvement with range of motion of both shoulders.  Imaging: No results found.  Labs: No results found for: HGBA1C, ESRSEDRATE, CRP, LABURIC, REPTSTATUS, GRAMSTAIN, CULT, LABORGA  Orders:  No orders of the defined types were placed in this encounter.  No orders of the defined types were placed in this encounter.    Procedures: No procedures performed  Clinical Data: No additional findings.  ROS:  All  other systems negative, except as noted in the HPI. Review of Systems  Objective: Vital Signs: Ht 5\' 4"  (1.626 m)   Wt 197 lb (89.4 kg)   BMI 33.81 kg/m   Specialty Comments:  No specialty comments available.  PMFS History: Patient Active Problem List   Diagnosis Date Noted  . Chronic pain of left knee 02/05/2017  . Nontraumatic incomplete tear of left rotator cuff 10/19/2016  . Calf cramp 10/19/2016   Past Medical History:  Diagnosis Date  . Chronic pain of both knees   . Diverticulitis   . Hypertension   . Polycystic ovarian syndrome   . Rotator cuff tear     History reviewed. No pertinent family history.  Past Surgical History:  Procedure Laterality Date  . CHOLECYSTECTOMY     Social History   Occupational History  . Not on file.   Social History Main Topics  . Smoking status: Never Smoker  . Smokeless tobacco: Never Used  . Alcohol use No  . Drug use: No  . Sexual activity: Not on file

## 2017-02-06 DIAGNOSIS — N83299 Other ovarian cyst, unspecified side: Secondary | ICD-10-CM | POA: Diagnosis not present

## 2017-02-08 NOTE — Telephone Encounter (Signed)
[employer] requires [employer] try and fail first so information submitted to [employer] for left knee injection.

## 2017-02-09 DIAGNOSIS — M25561 Pain in right knee: Secondary | ICD-10-CM | POA: Diagnosis not present

## 2017-02-09 DIAGNOSIS — M25511 Pain in right shoulder: Secondary | ICD-10-CM | POA: Diagnosis not present

## 2017-02-13 DIAGNOSIS — M25561 Pain in right knee: Secondary | ICD-10-CM | POA: Diagnosis not present

## 2017-02-13 DIAGNOSIS — M25511 Pain in right shoulder: Secondary | ICD-10-CM | POA: Diagnosis not present

## 2017-02-14 DIAGNOSIS — G8929 Other chronic pain: Secondary | ICD-10-CM | POA: Diagnosis not present

## 2017-02-14 DIAGNOSIS — M25511 Pain in right shoulder: Secondary | ICD-10-CM | POA: Diagnosis not present

## 2017-02-14 DIAGNOSIS — M25561 Pain in right knee: Secondary | ICD-10-CM | POA: Diagnosis not present

## 2017-02-14 DIAGNOSIS — M545 Low back pain: Secondary | ICD-10-CM | POA: Diagnosis not present

## 2017-02-14 DIAGNOSIS — R195 Other fecal abnormalities: Secondary | ICD-10-CM | POA: Diagnosis not present

## 2017-02-14 DIAGNOSIS — R1084 Generalized abdominal pain: Secondary | ICD-10-CM | POA: Diagnosis not present

## 2017-02-14 NOTE — Telephone Encounter (Signed)
Prior authorization submitted today for [employer] with clinicals, this was commented on [employer] portal and on my list as well. Pending clinical review for approval.

## 2017-02-19 DIAGNOSIS — K5732 Diverticulitis of large intestine without perforation or abscess without bleeding: Secondary | ICD-10-CM | POA: Diagnosis not present

## 2017-02-19 DIAGNOSIS — R933 Abnormal findings on diagnostic imaging of other parts of digestive tract: Secondary | ICD-10-CM | POA: Diagnosis not present

## 2017-02-19 DIAGNOSIS — R1012 Left upper quadrant pain: Secondary | ICD-10-CM | POA: Diagnosis not present

## 2017-02-19 DIAGNOSIS — K5901 Slow transit constipation: Secondary | ICD-10-CM | POA: Diagnosis not present

## 2017-02-19 NOTE — Telephone Encounter (Signed)
Checked [employer] portal and prior Josem Kaufmann is still pending for injection. Notes in portal advised that rep called [employer] 02/16/17 @5pm  to confirm it is on file and was received on 02/14/17 and requires 7-10 business days for consideration will continue to hold message in box pending approval.

## 2017-02-20 ENCOUNTER — Telehealth (INDEPENDENT_AMBULATORY_CARE_PROVIDER_SITE_OTHER): Payer: Self-pay

## 2017-02-20 NOTE — Telephone Encounter (Signed)
I called utilization management and advised that I received communication tht advised that the Allison Frost did not require PA of injection but that it still may not be covered. I advised that they had requested the prior auth through the [employer] portal. The representative directed me to the provider portal for [employer] [place] website and advised that injection will be covered if the Allison Frost has met the following criteria: xray with documentation of OA, joint space narrowing of subchondral sclerosis, documentation that OA interferes with ADLS, persistent pain for the past 6 months despite use of BOTH physical therapy and medical management with NSAIDS, tylenol or other analgesic medication.  The Allison Frost has an appt with Dr. Sharol Given 02/26/17 and will explain coverage and next steps with Allison Frost.

## 2017-02-20 NOTE — Telephone Encounter (Signed)
Will sign off on this message. Entered another message in the pt's chart after follow up call with utilization management with her insurance company and Dr. Sharol Given will discuss the decision and steps to follow at appt.

## 2017-02-23 DIAGNOSIS — M25511 Pain in right shoulder: Secondary | ICD-10-CM | POA: Diagnosis not present

## 2017-02-23 DIAGNOSIS — M25561 Pain in right knee: Secondary | ICD-10-CM | POA: Diagnosis not present

## 2017-02-26 ENCOUNTER — Encounter (INDEPENDENT_AMBULATORY_CARE_PROVIDER_SITE_OTHER): Payer: Self-pay | Admitting: Orthopedic Surgery

## 2017-02-26 ENCOUNTER — Ambulatory Visit (INDEPENDENT_AMBULATORY_CARE_PROVIDER_SITE_OTHER): Payer: BLUE CROSS/BLUE SHIELD | Admitting: Orthopedic Surgery

## 2017-02-26 DIAGNOSIS — M75112 Incomplete rotator cuff tear or rupture of left shoulder, not specified as traumatic: Secondary | ICD-10-CM

## 2017-02-26 DIAGNOSIS — M1711 Unilateral primary osteoarthritis, right knee: Secondary | ICD-10-CM

## 2017-02-26 NOTE — Progress Notes (Signed)
Office Visit Note   Patient: Allison Frost           Date of Birth: 19-Aug-1971           MRN: [id number] Visit Date: 02/26/2017              Requested by: Glenford Bayley, DO [place], [place] [address] PCP: Glenford Bayley, DO  Chief Complaint  Patient presents with  . Right Shoulder - Follow-up  . Left Shoulder - Follow-up      HPI: Patient presents in follow-up for several issues #1 she status post bilateral shoulder arthroscopy she states she still having a little bit of a pinching sensation over the lateral aspect of the left shoulder. She is complaining about 7 weeks of physical therapy and will start physical therapy at her work [employer] nutrition[employer]. She is completed 7 weeks of formalized physical therapy. She states injuring this time she is also been doing therapy for both knees they have combined therapy for the shoulders and knees with formalized physical therapy for her knees as well she states she still has arthritic pain in her knees which have been present for several years and has undergone approximate 7 weeks of formalized physical therapy for her knees as well.  Assessment & Plan: Visit Diagnoses:  1. Nontraumatic incomplete tear of left rotator cuff   2. Primary osteoarthritis of right knee     Plan: She will start her therapy with her personal trainer for the shoulder and knees reevaluate in 4 weeks. Discussed that if her knee is not better we may need to consider hyaluronic acid injection for the left knee.  Follow-Up Instructions: Return in about 4 weeks (around 03/26/2017).   Ortho Exam  Patient is alert, oriented, no adenopathy, well-dressed, normal affect, normal respiratory effort. On examination patient has pain with range of motion of the left knee there is crepitation range of motion tenderness to palpation medial and lateral joint lines. Examination of both shoulders she has excellent range of motion with full abduction and flexion she has a little bit of an  internal rotation limitation for the right shoulder the left shoulder has full internal rotation able to touch approximately T6.  Imaging: No results found.  Labs: No results found for: HGBA1C, ESRSEDRATE, CRP, LABURIC, REPTSTATUS, GRAMSTAIN, CULT, LABORGA  Orders:  No orders of the defined types were placed in this encounter.  No orders of the defined types were placed in this encounter.    Procedures: No procedures performed  Clinical Data: No additional findings.  ROS:  All other systems negative, except as noted in the HPI. Review of Systems  Objective: Vital Signs: There were no vitals taken for this visit.  Specialty Comments:  No specialty comments available.  PMFS History: Patient Active Problem List   Diagnosis Date Noted  . Chronic pain of left knee 02/05/2017  . Nontraumatic incomplete tear of left rotator cuff 10/19/2016  . Calf cramp 10/19/2016   Past Medical History:  Diagnosis Date  . Chronic pain of both knees   . Diverticulitis   . Hypertension   . Polycystic ovarian syndrome   . Rotator cuff tear     History reviewed. No pertinent family history.  Past Surgical History:  Procedure Laterality Date  . CHOLECYSTECTOMY     Social History   Occupational History  . Not on file.   Social History Main Topics  . Smoking status: Never Smoker  . Smokeless tobacco:  Never Used  . Alcohol use No  . Drug use: No  . Sexual activity: Not on file

## 2017-03-02 DIAGNOSIS — M25562 Pain in left knee: Secondary | ICD-10-CM | POA: Diagnosis not present

## 2017-03-02 DIAGNOSIS — M25561 Pain in right knee: Secondary | ICD-10-CM | POA: Diagnosis not present

## 2017-03-08 DIAGNOSIS — M25561 Pain in right knee: Secondary | ICD-10-CM | POA: Diagnosis not present

## 2017-03-08 DIAGNOSIS — M25562 Pain in left knee: Secondary | ICD-10-CM | POA: Diagnosis not present

## 2017-03-12 DIAGNOSIS — M25561 Pain in right knee: Secondary | ICD-10-CM | POA: Diagnosis not present

## 2017-03-12 DIAGNOSIS — R102 Pelvic and perineal pain: Secondary | ICD-10-CM | POA: Diagnosis not present

## 2017-03-12 DIAGNOSIS — M25562 Pain in left knee: Secondary | ICD-10-CM | POA: Diagnosis not present

## 2017-03-13 DIAGNOSIS — K5730 Diverticulosis of large intestine without perforation or abscess without bleeding: Secondary | ICD-10-CM | POA: Diagnosis not present

## 2017-03-13 DIAGNOSIS — K449 Diaphragmatic hernia without obstruction or gangrene: Secondary | ICD-10-CM | POA: Diagnosis not present

## 2017-03-13 DIAGNOSIS — K2970 Gastritis, unspecified, without bleeding: Secondary | ICD-10-CM | POA: Diagnosis not present

## 2017-03-13 DIAGNOSIS — R933 Abnormal findings on diagnostic imaging of other parts of digestive tract: Secondary | ICD-10-CM | POA: Diagnosis not present

## 2017-03-13 DIAGNOSIS — K2930 Chronic superficial gastritis without bleeding: Secondary | ICD-10-CM | POA: Diagnosis not present

## 2017-03-13 DIAGNOSIS — K640 First degree hemorrhoids: Secondary | ICD-10-CM | POA: Diagnosis not present

## 2017-03-13 DIAGNOSIS — R1012 Left upper quadrant pain: Secondary | ICD-10-CM | POA: Diagnosis not present

## 2017-03-16 DIAGNOSIS — M25562 Pain in left knee: Secondary | ICD-10-CM | POA: Diagnosis not present

## 2017-03-16 DIAGNOSIS — M25561 Pain in right knee: Secondary | ICD-10-CM | POA: Diagnosis not present

## 2017-03-23 DIAGNOSIS — M25561 Pain in right knee: Secondary | ICD-10-CM | POA: Diagnosis not present

## 2017-03-23 DIAGNOSIS — M25562 Pain in left knee: Secondary | ICD-10-CM | POA: Diagnosis not present

## 2017-03-26 ENCOUNTER — Ambulatory Visit (INDEPENDENT_AMBULATORY_CARE_PROVIDER_SITE_OTHER): Payer: Self-pay | Admitting: Orthopedic Surgery

## 2017-03-26 ENCOUNTER — Encounter (INDEPENDENT_AMBULATORY_CARE_PROVIDER_SITE_OTHER): Payer: Self-pay | Admitting: Orthopedic Surgery

## 2017-03-26 VITALS — Ht 64.0 in | Wt 197.0 lb

## 2017-03-26 DIAGNOSIS — M25562 Pain in left knee: Secondary | ICD-10-CM

## 2017-03-26 DIAGNOSIS — M75112 Incomplete rotator cuff tear or rupture of left shoulder, not specified as traumatic: Secondary | ICD-10-CM

## 2017-03-26 DIAGNOSIS — G8929 Other chronic pain: Secondary | ICD-10-CM

## 2017-03-26 NOTE — Progress Notes (Signed)
Office Visit Note   Patient: Allison Frost           Date of Birth: 01/10/1971           MRN: [id number] Visit Date: 03/26/2017              Requested by: Glenford Bayley, DO [place],  [id number] PCP: Glenford Bayley, DO  Chief Complaint  Patient presents with  . Left Shoulder - Follow-up  . Right Shoulder - Follow-up  . Right Knee - Follow-up  . Left Knee - Follow-up  . Left Foot - Pain      HPI: Patient presents in follow-up for both shoulders as well as arthritis of both knees left worse than right now. She states she gets very temporary relief from steroid injection to the left knee. She has been working with physical therapy for VMO strengthening. Patient still has pain in the patellofemoral joint with crunching with activities of daily living which is worse at the end of the day. Patient states she feels like her increased flatfoot on the left is causing increased stress on her knee.  Assessment & Plan: Visit Diagnoses:  1. Chronic pain of left knee   2. Nontraumatic incomplete tear of left rotator cuff     Plan: Recommend continue VMO strengthening recommended over-the-counter orthotics continue with her upper extremity strengthening as well.  Follow-Up Instructions: Return if symptoms worsen or fail to improve.   Ortho Exam  Patient is alert, oriented, no adenopathy, well-dressed, normal affect, normal respiratory effort. Examination patient has normal gait. She has no pronation or external rotation to the left foot compared to the right she does have a slightly decreased arch in the left compared to the right she has good pulses bilaterally there is no difference with the slight external rotation for both lower extremities. She has crepitation in the patellofemoral joint with range of motion of the left knee primarily tender to palpation in the patellofemoral joint. Medial and lateral joint lines are minimally tender to palpation. There is no effusion no  redness.  Imaging: No results found.  Labs: No results found for: HGBA1C, ESRSEDRATE, CRP, LABURIC, REPTSTATUS, GRAMSTAIN, CULT, LABORGA  Orders:  No orders of the defined types were placed in this encounter.  No orders of the defined types were placed in this encounter.    Procedures: No procedures performed  Clinical Data: No additional findings.  ROS:  All other systems negative, except as noted in the HPI. Review of Systems  Objective: Vital Signs: Ht 5\' 4"  (1.626 m)   Wt 197 lb (89.4 kg)   BMI 33.81 kg/m   Specialty Comments:  No specialty comments available.  PMFS History: Patient Active Problem List   Diagnosis Date Noted  . Chronic pain of left knee 02/05/2017  . Nontraumatic incomplete tear of left rotator cuff 10/19/2016  . Calf cramp 10/19/2016   Past Medical History:  Diagnosis Date  . Chronic pain of both knees   . Diverticulitis   . Hypertension   . Polycystic ovarian syndrome   . Rotator cuff tear     History reviewed. No pertinent family history.  Past Surgical History:  Procedure Laterality Date  . CHOLECYSTECTOMY     Social History   Occupational History  . Not on file.   Social History Main Topics  . Smoking status: Never Smoker  . Smokeless tobacco: Never Used  . Alcohol use No  . Drug use:  No  . Sexual activity: Not on file

## 2017-03-28 DIAGNOSIS — M25561 Pain in right knee: Secondary | ICD-10-CM | POA: Diagnosis not present

## 2017-03-28 DIAGNOSIS — M25562 Pain in left knee: Secondary | ICD-10-CM | POA: Diagnosis not present

## 2017-04-02 DIAGNOSIS — M25561 Pain in right knee: Secondary | ICD-10-CM | POA: Diagnosis not present

## 2017-04-02 DIAGNOSIS — M25562 Pain in left knee: Secondary | ICD-10-CM | POA: Diagnosis not present

## 2017-04-04 DIAGNOSIS — M542 Cervicalgia: Secondary | ICD-10-CM | POA: Diagnosis not present

## 2017-04-04 DIAGNOSIS — G43009 Migraine without aura, not intractable, without status migrainosus: Secondary | ICD-10-CM | POA: Diagnosis not present

## 2017-04-04 DIAGNOSIS — G44219 Episodic tension-type headache, not intractable: Secondary | ICD-10-CM | POA: Diagnosis not present

## 2018-08-04 DIAGNOSIS — Z23 Encounter for immunization: Secondary | ICD-10-CM | POA: Diagnosis not present

## 2019-06-11 DIAGNOSIS — Z0000 Encounter for general adult medical examination without abnormal findings: Secondary | ICD-10-CM | POA: Diagnosis not present

## 2019-06-11 DIAGNOSIS — Z6841 Body Mass Index (BMI) 40.0 and over, adult: Secondary | ICD-10-CM | POA: Diagnosis not present

## 2019-06-11 DIAGNOSIS — Z13220 Encounter for screening for lipoid disorders: Secondary | ICD-10-CM | POA: Diagnosis not present

## 2019-06-11 DIAGNOSIS — E662 Morbid (severe) obesity with alveolar hypoventilation: Secondary | ICD-10-CM | POA: Diagnosis not present

## 2019-10-13 DIAGNOSIS — R1032 Left lower quadrant pain: Secondary | ICD-10-CM | POA: Diagnosis not present

## 2019-10-15 DIAGNOSIS — R1032 Left lower quadrant pain: Secondary | ICD-10-CM | POA: Diagnosis not present

## 2019-10-15 DIAGNOSIS — K5730 Diverticulosis of large intestine without perforation or abscess without bleeding: Secondary | ICD-10-CM | POA: Diagnosis not present

## 2019-10-15 DIAGNOSIS — K760 Fatty (change of) liver, not elsewhere classified: Secondary | ICD-10-CM | POA: Diagnosis not present

## 2019-10-15 DIAGNOSIS — Z9049 Acquired absence of other specified parts of digestive tract: Secondary | ICD-10-CM | POA: Diagnosis not present

## 2019-11-03 DIAGNOSIS — K5732 Diverticulitis of large intestine without perforation or abscess without bleeding: Secondary | ICD-10-CM | POA: Diagnosis not present

## 2019-11-03 DIAGNOSIS — K5901 Slow transit constipation: Secondary | ICD-10-CM | POA: Diagnosis not present

## 2019-12-08 ENCOUNTER — Other Ambulatory Visit: Payer: Self-pay | Admitting: Physician Assistant

## 2019-12-08 DIAGNOSIS — K5732 Diverticulitis of large intestine without perforation or abscess without bleeding: Secondary | ICD-10-CM | POA: Diagnosis not present

## 2019-12-08 DIAGNOSIS — K5901 Slow transit constipation: Secondary | ICD-10-CM | POA: Diagnosis not present

## 2019-12-08 DIAGNOSIS — R1032 Left lower quadrant pain: Secondary | ICD-10-CM

## 2019-12-12 ENCOUNTER — Ambulatory Visit
Admission: RE | Admit: 2019-12-12 | Discharge: 2019-12-12 | Disposition: A | Discharge status: Home / Self Care | Payer: BLUE CROSS/BLUE SHIELD | Source: Ambulatory Visit | Attending: Physician Assistant | Admitting: Physician Assistant

## 2019-12-12 DIAGNOSIS — K5732 Diverticulitis of large intestine without perforation or abscess without bleeding: Secondary | ICD-10-CM

## 2019-12-12 DIAGNOSIS — K5730 Diverticulosis of large intestine without perforation or abscess without bleeding: Secondary | ICD-10-CM | POA: Diagnosis not present

## 2019-12-12 DIAGNOSIS — R1032 Left lower quadrant pain: Secondary | ICD-10-CM

## 2019-12-12 MED ORDER — IOPAMIDOL (ISOVUE-300) INJECTION 61%
125.0000 mL | Freq: Once | INTRAVENOUS | Status: AC | PRN
  Administered 2019-12-12: 125 mL via INTRAVENOUS

## 2019-12-23 ENCOUNTER — Other Ambulatory Visit: Payer: BLUE CROSS/BLUE SHIELD

## 2020-01-06 DIAGNOSIS — Z01419 Encounter for gynecological examination (general) (routine) without abnormal findings: Secondary | ICD-10-CM | POA: Diagnosis not present

## 2020-01-06 DIAGNOSIS — Z808 Family history of malignant neoplasm of other organs or systems: Secondary | ICD-10-CM | POA: Diagnosis not present

## 2020-01-06 DIAGNOSIS — Z6841 Body Mass Index (BMI) 40.0 and over, adult: Secondary | ICD-10-CM | POA: Diagnosis not present

## 2020-01-06 DIAGNOSIS — Z803 Family history of malignant neoplasm of breast: Secondary | ICD-10-CM | POA: Diagnosis not present

## 2020-01-06 DIAGNOSIS — Z1231 Encounter for screening mammogram for malignant neoplasm of breast: Secondary | ICD-10-CM | POA: Diagnosis not present

## 2020-02-20 DIAGNOSIS — Z809 Family history of malignant neoplasm, unspecified: Secondary | ICD-10-CM | POA: Diagnosis not present

## 2020-02-20 DIAGNOSIS — M791 Myalgia, unspecified site: Secondary | ICD-10-CM | POA: Diagnosis not present

## 2020-02-23 ENCOUNTER — Other Ambulatory Visit: Payer: Self-pay | Admitting: Obstetrics and Gynecology

## 2020-02-23 DIAGNOSIS — Z803 Family history of malignant neoplasm of breast: Secondary | ICD-10-CM

## 2020-03-25 DIAGNOSIS — H15121 Nodular episcleritis, right eye: Secondary | ICD-10-CM | POA: Diagnosis not present

## 2020-03-25 DIAGNOSIS — H11449 Conjunctival cysts, unspecified eye: Secondary | ICD-10-CM | POA: Diagnosis not present

## 2020-03-30 ENCOUNTER — Ambulatory Visit
Admission: RE | Admit: 2020-03-30 | Discharge: 2020-03-30 | Disposition: A | Discharge status: Home / Self Care | Payer: BC Managed Care – PPO | Source: Ambulatory Visit | Attending: Obstetrics and Gynecology | Admitting: Obstetrics and Gynecology

## 2020-03-30 DIAGNOSIS — Z803 Family history of malignant neoplasm of breast: Secondary | ICD-10-CM

## 2020-03-30 MED ORDER — GADOBUTROL 1 MMOL/ML IV SOLN
10.0000 mL | Freq: Once | INTRAVENOUS | Status: AC | PRN
  Administered 2020-03-30: 11:00:00 10 mL via INTRAVENOUS

## 2020-04-14 DIAGNOSIS — H15121 Nodular episcleritis, right eye: Secondary | ICD-10-CM | POA: Diagnosis not present

## 2020-04-26 DIAGNOSIS — H15121 Nodular episcleritis, right eye: Secondary | ICD-10-CM | POA: Diagnosis not present

## 2020-05-17 DIAGNOSIS — M9901 Segmental and somatic dysfunction of cervical region: Secondary | ICD-10-CM | POA: Diagnosis not present

## 2020-05-17 DIAGNOSIS — M9902 Segmental and somatic dysfunction of thoracic region: Secondary | ICD-10-CM | POA: Diagnosis not present

## 2020-05-17 DIAGNOSIS — H20021 Recurrent acute iridocyclitis, right eye: Secondary | ICD-10-CM | POA: Diagnosis not present

## 2020-05-17 DIAGNOSIS — M542 Cervicalgia: Secondary | ICD-10-CM | POA: Diagnosis not present

## 2020-05-17 DIAGNOSIS — G44201 Tension-type headache, unspecified, intractable: Secondary | ICD-10-CM | POA: Diagnosis not present

## 2020-05-18 DIAGNOSIS — M542 Cervicalgia: Secondary | ICD-10-CM | POA: Diagnosis not present

## 2020-05-18 DIAGNOSIS — M9901 Segmental and somatic dysfunction of cervical region: Secondary | ICD-10-CM | POA: Diagnosis not present

## 2020-05-18 DIAGNOSIS — M9902 Segmental and somatic dysfunction of thoracic region: Secondary | ICD-10-CM | POA: Diagnosis not present

## 2020-05-18 DIAGNOSIS — G44201 Tension-type headache, unspecified, intractable: Secondary | ICD-10-CM | POA: Diagnosis not present

## 2020-05-19 DIAGNOSIS — M9901 Segmental and somatic dysfunction of cervical region: Secondary | ICD-10-CM | POA: Diagnosis not present

## 2020-05-19 DIAGNOSIS — M9902 Segmental and somatic dysfunction of thoracic region: Secondary | ICD-10-CM | POA: Diagnosis not present

## 2020-05-19 DIAGNOSIS — G44201 Tension-type headache, unspecified, intractable: Secondary | ICD-10-CM | POA: Diagnosis not present

## 2020-05-19 DIAGNOSIS — M542 Cervicalgia: Secondary | ICD-10-CM | POA: Diagnosis not present

## 2020-05-20 DIAGNOSIS — G44201 Tension-type headache, unspecified, intractable: Secondary | ICD-10-CM | POA: Diagnosis not present

## 2020-05-20 DIAGNOSIS — M9902 Segmental and somatic dysfunction of thoracic region: Secondary | ICD-10-CM | POA: Diagnosis not present

## 2020-05-20 DIAGNOSIS — M9901 Segmental and somatic dysfunction of cervical region: Secondary | ICD-10-CM | POA: Diagnosis not present

## 2020-05-20 DIAGNOSIS — M542 Cervicalgia: Secondary | ICD-10-CM | POA: Diagnosis not present

## 2020-05-25 DIAGNOSIS — M542 Cervicalgia: Secondary | ICD-10-CM | POA: Diagnosis not present

## 2020-05-25 DIAGNOSIS — M9901 Segmental and somatic dysfunction of cervical region: Secondary | ICD-10-CM | POA: Diagnosis not present

## 2020-05-25 DIAGNOSIS — G44201 Tension-type headache, unspecified, intractable: Secondary | ICD-10-CM | POA: Diagnosis not present

## 2020-05-25 DIAGNOSIS — M9902 Segmental and somatic dysfunction of thoracic region: Secondary | ICD-10-CM | POA: Diagnosis not present

## 2020-05-26 DIAGNOSIS — G44201 Tension-type headache, unspecified, intractable: Secondary | ICD-10-CM | POA: Diagnosis not present

## 2020-05-26 DIAGNOSIS — M9901 Segmental and somatic dysfunction of cervical region: Secondary | ICD-10-CM | POA: Diagnosis not present

## 2020-05-26 DIAGNOSIS — M9902 Segmental and somatic dysfunction of thoracic region: Secondary | ICD-10-CM | POA: Diagnosis not present

## 2020-05-26 DIAGNOSIS — M542 Cervicalgia: Secondary | ICD-10-CM | POA: Diagnosis not present

## 2020-05-31 DIAGNOSIS — G44201 Tension-type headache, unspecified, intractable: Secondary | ICD-10-CM | POA: Diagnosis not present

## 2020-05-31 DIAGNOSIS — M542 Cervicalgia: Secondary | ICD-10-CM | POA: Diagnosis not present

## 2020-05-31 DIAGNOSIS — M9902 Segmental and somatic dysfunction of thoracic region: Secondary | ICD-10-CM | POA: Diagnosis not present

## 2020-05-31 DIAGNOSIS — M9901 Segmental and somatic dysfunction of cervical region: Secondary | ICD-10-CM | POA: Diagnosis not present

## 2020-06-01 DIAGNOSIS — M9901 Segmental and somatic dysfunction of cervical region: Secondary | ICD-10-CM | POA: Diagnosis not present

## 2020-06-01 DIAGNOSIS — G44201 Tension-type headache, unspecified, intractable: Secondary | ICD-10-CM | POA: Diagnosis not present

## 2020-06-01 DIAGNOSIS — M542 Cervicalgia: Secondary | ICD-10-CM | POA: Diagnosis not present

## 2020-06-01 DIAGNOSIS — M9902 Segmental and somatic dysfunction of thoracic region: Secondary | ICD-10-CM | POA: Diagnosis not present

## 2020-06-03 DIAGNOSIS — M542 Cervicalgia: Secondary | ICD-10-CM | POA: Diagnosis not present

## 2020-06-03 DIAGNOSIS — G44201 Tension-type headache, unspecified, intractable: Secondary | ICD-10-CM | POA: Diagnosis not present

## 2020-06-03 DIAGNOSIS — M9901 Segmental and somatic dysfunction of cervical region: Secondary | ICD-10-CM | POA: Diagnosis not present

## 2020-06-03 DIAGNOSIS — M9902 Segmental and somatic dysfunction of thoracic region: Secondary | ICD-10-CM | POA: Diagnosis not present

## 2020-06-07 DIAGNOSIS — G44201 Tension-type headache, unspecified, intractable: Secondary | ICD-10-CM | POA: Diagnosis not present

## 2020-06-07 DIAGNOSIS — M542 Cervicalgia: Secondary | ICD-10-CM | POA: Diagnosis not present

## 2020-06-07 DIAGNOSIS — M9901 Segmental and somatic dysfunction of cervical region: Secondary | ICD-10-CM | POA: Diagnosis not present

## 2020-06-07 DIAGNOSIS — M9902 Segmental and somatic dysfunction of thoracic region: Secondary | ICD-10-CM | POA: Diagnosis not present

## 2020-06-08 DIAGNOSIS — G44201 Tension-type headache, unspecified, intractable: Secondary | ICD-10-CM | POA: Diagnosis not present

## 2020-06-08 DIAGNOSIS — M9902 Segmental and somatic dysfunction of thoracic region: Secondary | ICD-10-CM | POA: Diagnosis not present

## 2020-06-08 DIAGNOSIS — M9901 Segmental and somatic dysfunction of cervical region: Secondary | ICD-10-CM | POA: Diagnosis not present

## 2020-06-08 DIAGNOSIS — M542 Cervicalgia: Secondary | ICD-10-CM | POA: Diagnosis not present

## 2020-06-09 DIAGNOSIS — M9902 Segmental and somatic dysfunction of thoracic region: Secondary | ICD-10-CM | POA: Diagnosis not present

## 2020-06-09 DIAGNOSIS — M542 Cervicalgia: Secondary | ICD-10-CM | POA: Diagnosis not present

## 2020-06-09 DIAGNOSIS — G44201 Tension-type headache, unspecified, intractable: Secondary | ICD-10-CM | POA: Diagnosis not present

## 2020-06-09 DIAGNOSIS — M9901 Segmental and somatic dysfunction of cervical region: Secondary | ICD-10-CM | POA: Diagnosis not present

## 2020-06-14 DIAGNOSIS — G44201 Tension-type headache, unspecified, intractable: Secondary | ICD-10-CM | POA: Diagnosis not present

## 2020-06-14 DIAGNOSIS — M9901 Segmental and somatic dysfunction of cervical region: Secondary | ICD-10-CM | POA: Diagnosis not present

## 2020-06-14 DIAGNOSIS — M9902 Segmental and somatic dysfunction of thoracic region: Secondary | ICD-10-CM | POA: Diagnosis not present

## 2020-06-14 DIAGNOSIS — M542 Cervicalgia: Secondary | ICD-10-CM | POA: Diagnosis not present

## 2020-06-15 DIAGNOSIS — G44201 Tension-type headache, unspecified, intractable: Secondary | ICD-10-CM | POA: Diagnosis not present

## 2020-06-15 DIAGNOSIS — M9901 Segmental and somatic dysfunction of cervical region: Secondary | ICD-10-CM | POA: Diagnosis not present

## 2020-06-15 DIAGNOSIS — M542 Cervicalgia: Secondary | ICD-10-CM | POA: Diagnosis not present

## 2020-06-15 DIAGNOSIS — H2000 Unspecified acute and subacute iridocyclitis: Secondary | ICD-10-CM | POA: Diagnosis not present

## 2020-06-15 DIAGNOSIS — M9902 Segmental and somatic dysfunction of thoracic region: Secondary | ICD-10-CM | POA: Diagnosis not present

## 2020-06-17 DIAGNOSIS — M542 Cervicalgia: Secondary | ICD-10-CM | POA: Diagnosis not present

## 2020-06-17 DIAGNOSIS — M9902 Segmental and somatic dysfunction of thoracic region: Secondary | ICD-10-CM | POA: Diagnosis not present

## 2020-06-17 DIAGNOSIS — M9901 Segmental and somatic dysfunction of cervical region: Secondary | ICD-10-CM | POA: Diagnosis not present

## 2020-06-17 DIAGNOSIS — G44201 Tension-type headache, unspecified, intractable: Secondary | ICD-10-CM | POA: Diagnosis not present

## 2020-06-21 DIAGNOSIS — G44201 Tension-type headache, unspecified, intractable: Secondary | ICD-10-CM | POA: Diagnosis not present

## 2020-06-21 DIAGNOSIS — M9901 Segmental and somatic dysfunction of cervical region: Secondary | ICD-10-CM | POA: Diagnosis not present

## 2020-06-21 DIAGNOSIS — M542 Cervicalgia: Secondary | ICD-10-CM | POA: Diagnosis not present

## 2020-06-21 DIAGNOSIS — M9902 Segmental and somatic dysfunction of thoracic region: Secondary | ICD-10-CM | POA: Diagnosis not present

## 2020-06-24 DIAGNOSIS — M9902 Segmental and somatic dysfunction of thoracic region: Secondary | ICD-10-CM | POA: Diagnosis not present

## 2020-06-24 DIAGNOSIS — M542 Cervicalgia: Secondary | ICD-10-CM | POA: Diagnosis not present

## 2020-06-24 DIAGNOSIS — G44201 Tension-type headache, unspecified, intractable: Secondary | ICD-10-CM | POA: Diagnosis not present

## 2020-06-24 DIAGNOSIS — M9901 Segmental and somatic dysfunction of cervical region: Secondary | ICD-10-CM | POA: Diagnosis not present

## 2020-06-28 DIAGNOSIS — M9902 Segmental and somatic dysfunction of thoracic region: Secondary | ICD-10-CM | POA: Diagnosis not present

## 2020-06-28 DIAGNOSIS — G44201 Tension-type headache, unspecified, intractable: Secondary | ICD-10-CM | POA: Diagnosis not present

## 2020-06-28 DIAGNOSIS — M542 Cervicalgia: Secondary | ICD-10-CM | POA: Diagnosis not present

## 2020-06-28 DIAGNOSIS — M9901 Segmental and somatic dysfunction of cervical region: Secondary | ICD-10-CM | POA: Diagnosis not present

## 2020-07-01 DIAGNOSIS — M9902 Segmental and somatic dysfunction of thoracic region: Secondary | ICD-10-CM | POA: Diagnosis not present

## 2020-07-01 DIAGNOSIS — M542 Cervicalgia: Secondary | ICD-10-CM | POA: Diagnosis not present

## 2020-07-01 DIAGNOSIS — M9901 Segmental and somatic dysfunction of cervical region: Secondary | ICD-10-CM | POA: Diagnosis not present

## 2020-07-01 DIAGNOSIS — G44201 Tension-type headache, unspecified, intractable: Secondary | ICD-10-CM | POA: Diagnosis not present

## 2020-07-05 DIAGNOSIS — M9901 Segmental and somatic dysfunction of cervical region: Secondary | ICD-10-CM | POA: Diagnosis not present

## 2020-07-05 DIAGNOSIS — M9902 Segmental and somatic dysfunction of thoracic region: Secondary | ICD-10-CM | POA: Diagnosis not present

## 2020-07-05 DIAGNOSIS — M542 Cervicalgia: Secondary | ICD-10-CM | POA: Diagnosis not present

## 2020-07-05 DIAGNOSIS — G44201 Tension-type headache, unspecified, intractable: Secondary | ICD-10-CM | POA: Diagnosis not present

## 2020-07-08 DIAGNOSIS — M9902 Segmental and somatic dysfunction of thoracic region: Secondary | ICD-10-CM | POA: Diagnosis not present

## 2020-07-08 DIAGNOSIS — G44201 Tension-type headache, unspecified, intractable: Secondary | ICD-10-CM | POA: Diagnosis not present

## 2020-07-08 DIAGNOSIS — M9901 Segmental and somatic dysfunction of cervical region: Secondary | ICD-10-CM | POA: Diagnosis not present

## 2020-07-08 DIAGNOSIS — M542 Cervicalgia: Secondary | ICD-10-CM | POA: Diagnosis not present

## 2020-07-12 DIAGNOSIS — M542 Cervicalgia: Secondary | ICD-10-CM | POA: Diagnosis not present

## 2020-07-12 DIAGNOSIS — M9901 Segmental and somatic dysfunction of cervical region: Secondary | ICD-10-CM | POA: Diagnosis not present

## 2020-07-12 DIAGNOSIS — G44201 Tension-type headache, unspecified, intractable: Secondary | ICD-10-CM | POA: Diagnosis not present

## 2020-07-12 DIAGNOSIS — M9902 Segmental and somatic dysfunction of thoracic region: Secondary | ICD-10-CM | POA: Diagnosis not present

## 2020-07-15 DIAGNOSIS — M9902 Segmental and somatic dysfunction of thoracic region: Secondary | ICD-10-CM | POA: Diagnosis not present

## 2020-07-15 DIAGNOSIS — M542 Cervicalgia: Secondary | ICD-10-CM | POA: Diagnosis not present

## 2020-07-15 DIAGNOSIS — G44201 Tension-type headache, unspecified, intractable: Secondary | ICD-10-CM | POA: Diagnosis not present

## 2020-07-15 DIAGNOSIS — M9901 Segmental and somatic dysfunction of cervical region: Secondary | ICD-10-CM | POA: Diagnosis not present

## 2020-07-21 DIAGNOSIS — M542 Cervicalgia: Secondary | ICD-10-CM | POA: Diagnosis not present

## 2020-07-21 DIAGNOSIS — M9902 Segmental and somatic dysfunction of thoracic region: Secondary | ICD-10-CM | POA: Diagnosis not present

## 2020-07-21 DIAGNOSIS — M9901 Segmental and somatic dysfunction of cervical region: Secondary | ICD-10-CM | POA: Diagnosis not present

## 2020-07-21 DIAGNOSIS — G44201 Tension-type headache, unspecified, intractable: Secondary | ICD-10-CM | POA: Diagnosis not present

## 2020-07-26 DIAGNOSIS — M9901 Segmental and somatic dysfunction of cervical region: Secondary | ICD-10-CM | POA: Diagnosis not present

## 2020-07-26 DIAGNOSIS — M9902 Segmental and somatic dysfunction of thoracic region: Secondary | ICD-10-CM | POA: Diagnosis not present

## 2020-07-26 DIAGNOSIS — M542 Cervicalgia: Secondary | ICD-10-CM | POA: Diagnosis not present

## 2020-07-26 DIAGNOSIS — G44201 Tension-type headache, unspecified, intractable: Secondary | ICD-10-CM | POA: Diagnosis not present

## 2020-08-02 DIAGNOSIS — M9901 Segmental and somatic dysfunction of cervical region: Secondary | ICD-10-CM | POA: Diagnosis not present

## 2020-08-02 DIAGNOSIS — M9902 Segmental and somatic dysfunction of thoracic region: Secondary | ICD-10-CM | POA: Diagnosis not present

## 2020-08-02 DIAGNOSIS — G44201 Tension-type headache, unspecified, intractable: Secondary | ICD-10-CM | POA: Diagnosis not present

## 2020-08-02 DIAGNOSIS — M542 Cervicalgia: Secondary | ICD-10-CM | POA: Diagnosis not present

## 2020-08-09 DIAGNOSIS — M9901 Segmental and somatic dysfunction of cervical region: Secondary | ICD-10-CM | POA: Diagnosis not present

## 2020-08-09 DIAGNOSIS — M542 Cervicalgia: Secondary | ICD-10-CM | POA: Diagnosis not present

## 2020-08-09 DIAGNOSIS — G44201 Tension-type headache, unspecified, intractable: Secondary | ICD-10-CM | POA: Diagnosis not present

## 2020-08-09 DIAGNOSIS — M9902 Segmental and somatic dysfunction of thoracic region: Secondary | ICD-10-CM | POA: Diagnosis not present

## 2020-08-13 IMAGING — MR MR BREAST BILAT WO/W CM
8 of 12 series · 32 of 48 positions shown · IV contrast (gadavist)
Comparison: Previous mammography

CLINICAL DATA: Family history of breast cancer in a sister at the
age of 43.

LABS:  None
EXAM:
BILATERAL BREAST MRI WITH AND WITHOUT CONTRAST
TECHNIQUE: Multiplanar, multisequence MR images of both breasts were obtained
prior to and following the intravenous administration of 10 ml of
Gadavist

[Series 2: t2_tirm_tra ipat (a-p) · axial · 3.0mm · 0.82mm/px · 1 of 58 slices shown]
[im 1/58]
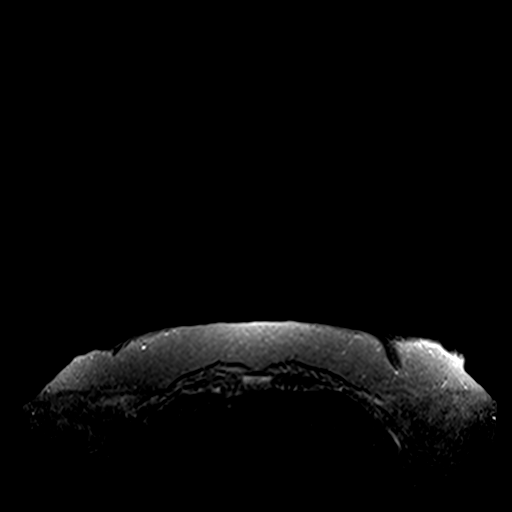

[Series 3: fl3d pre-cm no · axial · non-contrast · 1.2mm · 1.09mm/px · z∈[-65,+126]mm · 5 of 160 slices shown]
[im 1/160]
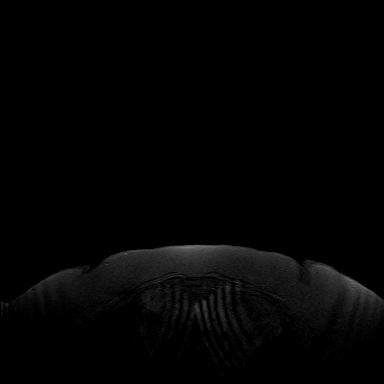
[im 40/160]
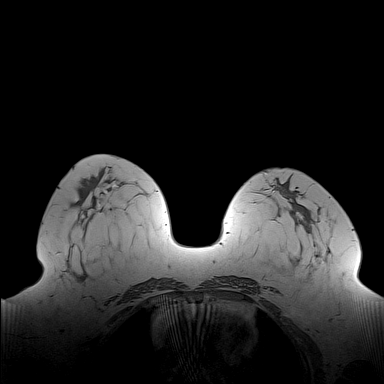
[im 80/160]
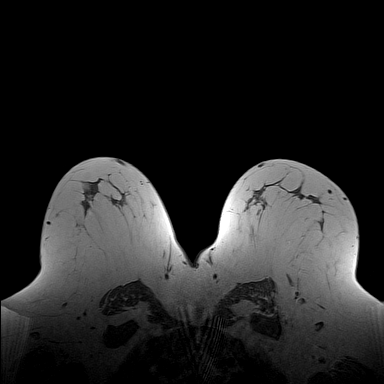
[im 120/160]
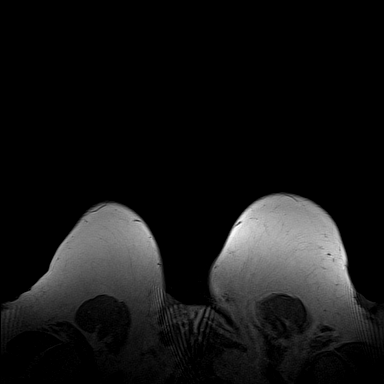
[im 160/160]
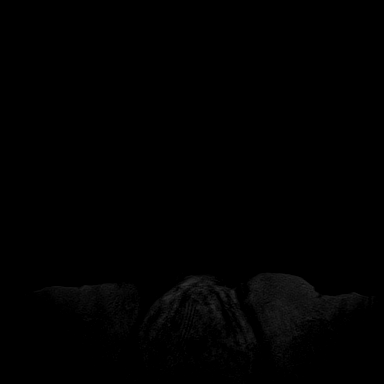

[Series 4: fl3d pre-cm · axial · non-contrast · 1.2mm · 1.09mm/px · z∈[-65,+126]mm · 5 of 160 slices shown]
[im 1/160]
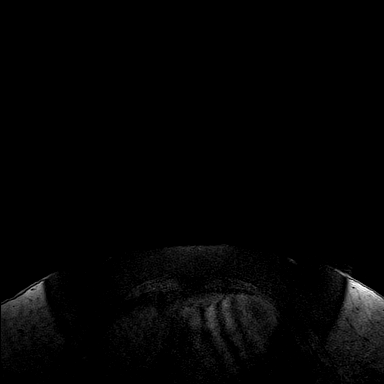
[im 40/160]
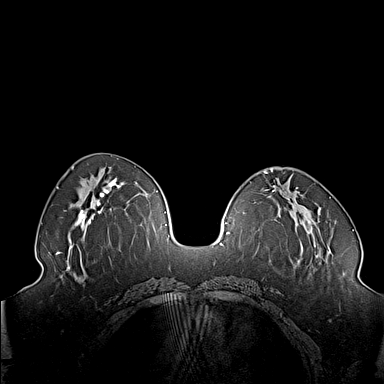
[im 80/160]
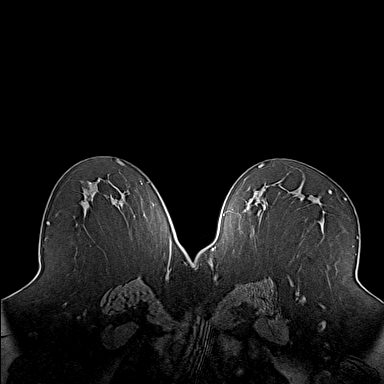
[im 120/160]
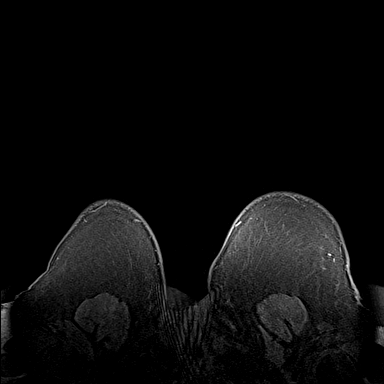
[im 160/160]
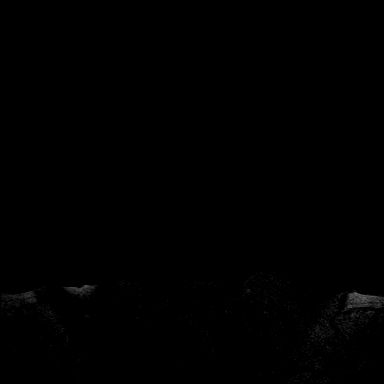

[Series 5: fl3d post-cm 20 · axial · 1.2mm · 1.09mm/px · z∈[-65,+126]mm · 5 of 160 slices shown (1 of 3)]
[im 1/160]
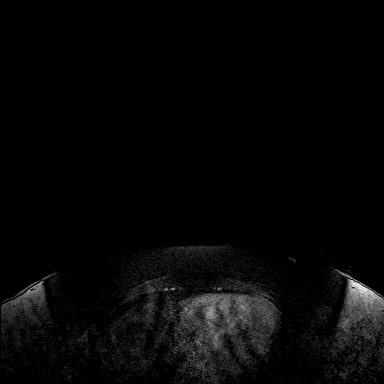
[im 40/160]
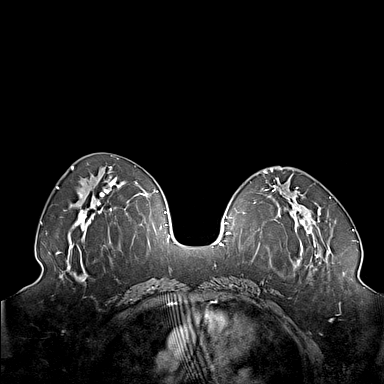
[im 80/160]
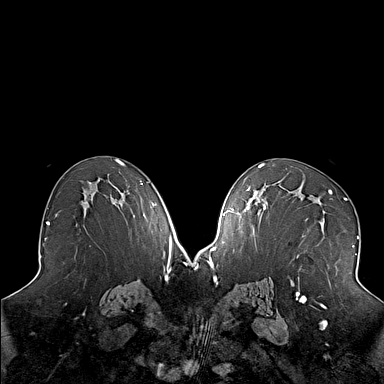
[im 120/160]
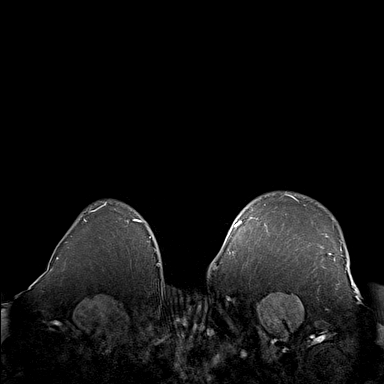
[im 160/160]
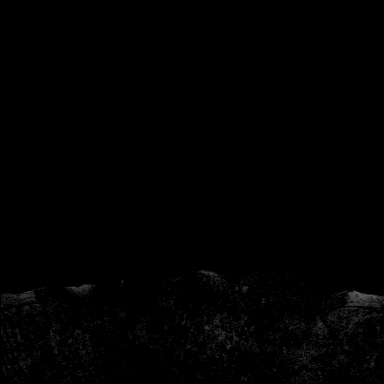

[Series 6: fl3d post-cm 20 · axial · 1.2mm · 1.09mm/px · z∈[-65,+126]mm · 5 of 160 slices shown (2 of 3)]
[im 1/160]
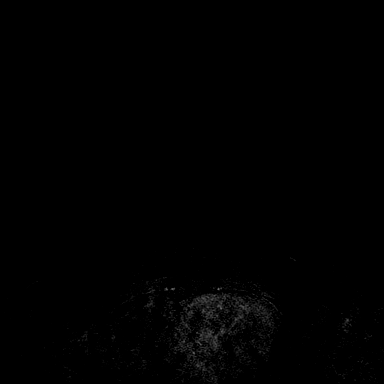
[im 40/160]
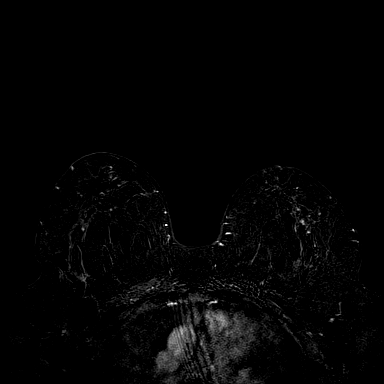
[im 80/160]
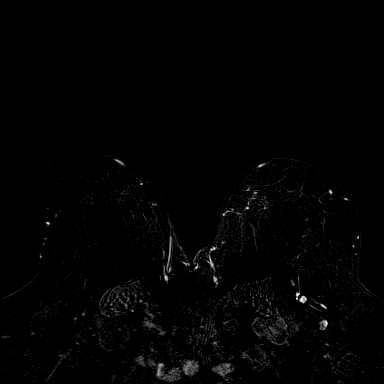
[im 120/160]
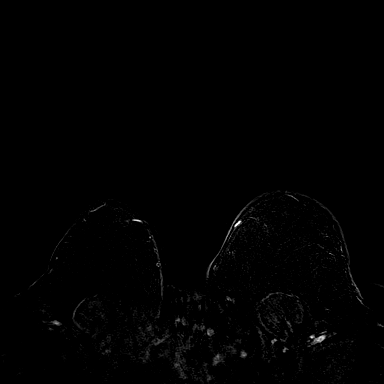
[im 160/160]
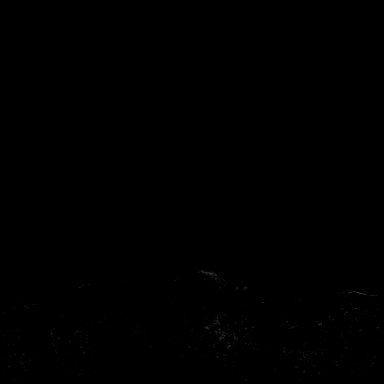

[Series 7: fl3d post-cm 20 · axial · 192.0mm · 1.09mm/px · 1 of 1 slices shown (3 of 3)]
[im 1/1]
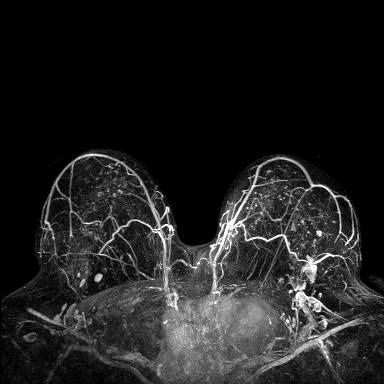

[Series 8: fl3d post-cm 3min · axial · 1.2mm · 1.09mm/px · z∈[-65,+126]mm · 6 of 160 slices shown]
[im 1/160]
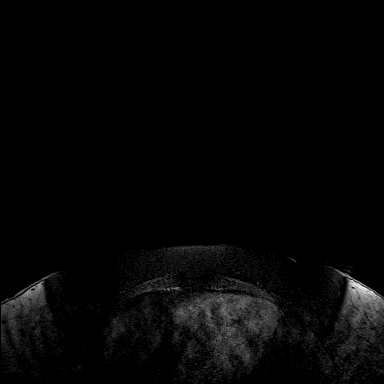
[im 32/160]
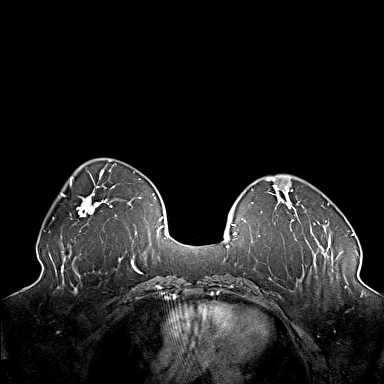
[im 64/160]
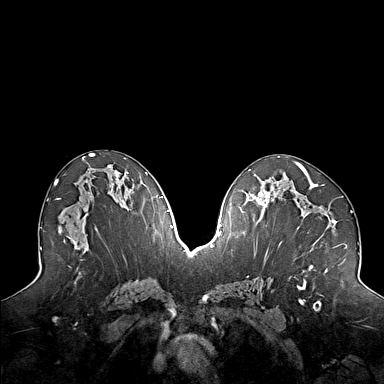
[im 96/160]
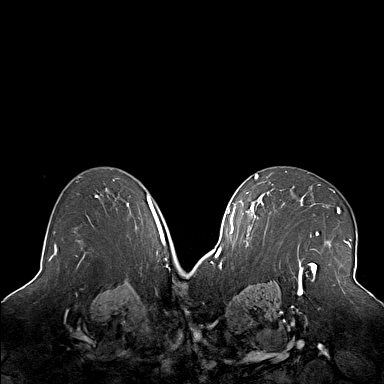
[im 128/160]
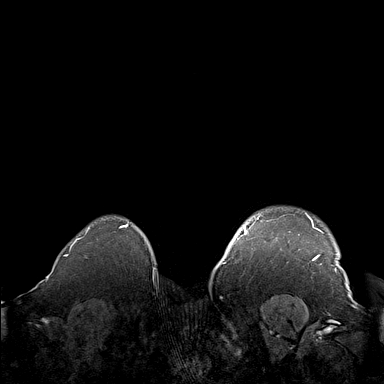
[im 160/160]
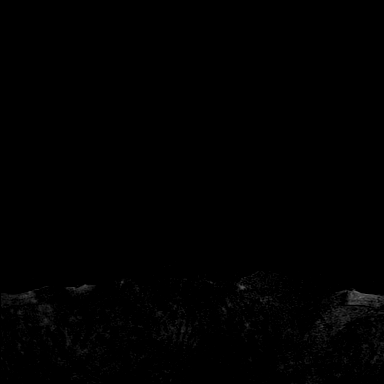

[Series 9: fl3d post-cm 3min_sub · axial · 1.2mm · 1.09mm/px · z∈[-65,+49]mm · 4 of 160 slices shown]
[im 1/160]
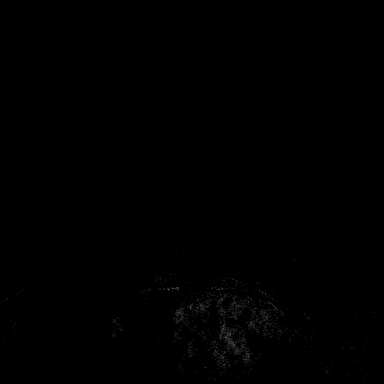
[im 32/160]
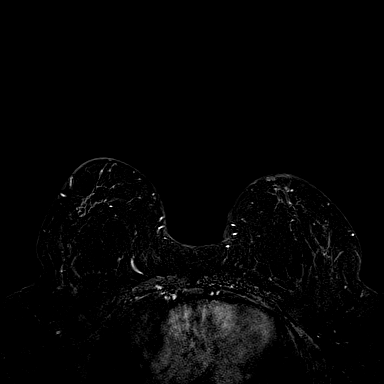
[im 64/160]
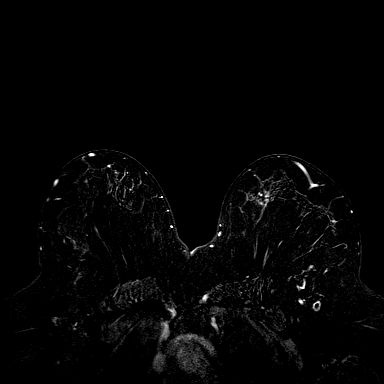
[im 96/160]
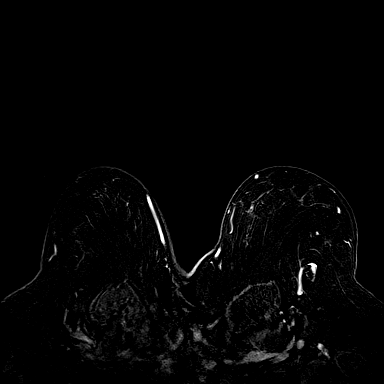

[32 of 48 positions shown; findings below may reference images not displayed]

Three-dimensional MR images were rendered by post-processing of the
original MR data on an independent workstation. The
three-dimensional MR images were interpreted, and findings are
reported in the following complete MRI report for this study. Three
dimensional images were evaluated at the independent [employer]
workstation
FINDINGS: Breast composition: b. Scattered fibroglandular tissue.

Background parenchymal enhancement: Mild

Right breast: No mass or abnormal enhancement.

Left breast: No mass or abnormal enhancement.

Lymph nodes: No abnormal appearing lymph nodes.

Ancillary findings:  None.
IMPRESSION: No MRI evidence of malignancy.

RECOMMENDATION:
Recommend assessing the patient's eligibility for genetics
counseling using the NCCN guidelines. I believe the patient is
likely be eligible for genetic counseling given her family history
with a sister diagnosed with breast cancer at the age of 43. If the
patient is found to be genetically positive for a breast cancer
mutation, recommend annual breast MRI and mammography. If the
patient is not eligible for genetic testing or is found to be
genetically negative, recommend assessing the patient's lifetime
risk for breast cancer with a family history based model such as the
Tyrer-Cuzick. If the patient's lifetime risk of breast cancer is
greater than 20%, recommend annual breast MRI and annual
mammography. If the patient is found to be either genetically
positive or high risk, recommend consultation with a physician
specializing in the management of high risk patients. If the patient
is not found to be high risk for breast cancer, recommend annual
mammography.

BI-RADS CATEGORY  1: Negative.

## 2022-11-01 DIAGNOSIS — R102 Pelvic and perineal pain: Secondary | ICD-10-CM | POA: Diagnosis not present

## 2022-11-01 DIAGNOSIS — N760 Acute vaginitis: Secondary | ICD-10-CM | POA: Diagnosis not present

## 2022-11-16 DIAGNOSIS — E282 Polycystic ovarian syndrome: Secondary | ICD-10-CM | POA: Diagnosis not present

## 2022-11-16 DIAGNOSIS — R61 Generalized hyperhidrosis: Secondary | ICD-10-CM | POA: Diagnosis not present

## 2022-11-16 DIAGNOSIS — R102 Pelvic and perineal pain: Secondary | ICD-10-CM | POA: Diagnosis not present

## 2022-11-16 DIAGNOSIS — N951 Menopausal and female climacteric states: Secondary | ICD-10-CM | POA: Diagnosis not present

## 2023-01-09 DIAGNOSIS — Z1231 Encounter for screening mammogram for malignant neoplasm of breast: Secondary | ICD-10-CM | POA: Diagnosis not present

## 2023-01-09 DIAGNOSIS — Z6841 Body Mass Index (BMI) 40.0 and over, adult: Secondary | ICD-10-CM | POA: Diagnosis not present

## 2023-01-09 DIAGNOSIS — Z01419 Encounter for gynecological examination (general) (routine) without abnormal findings: Secondary | ICD-10-CM | POA: Diagnosis not present

## 2023-05-07 DIAGNOSIS — M531 Cervicobrachial syndrome: Secondary | ICD-10-CM | POA: Diagnosis not present

## 2023-05-07 DIAGNOSIS — M9903 Segmental and somatic dysfunction of lumbar region: Secondary | ICD-10-CM | POA: Diagnosis not present

## 2023-05-07 DIAGNOSIS — M9904 Segmental and somatic dysfunction of sacral region: Secondary | ICD-10-CM | POA: Diagnosis not present

## 2023-05-07 DIAGNOSIS — M9901 Segmental and somatic dysfunction of cervical region: Secondary | ICD-10-CM | POA: Diagnosis not present

## 2023-05-07 DIAGNOSIS — M9902 Segmental and somatic dysfunction of thoracic region: Secondary | ICD-10-CM | POA: Diagnosis not present

## 2023-05-09 DIAGNOSIS — M9902 Segmental and somatic dysfunction of thoracic region: Secondary | ICD-10-CM | POA: Diagnosis not present

## 2023-05-09 DIAGNOSIS — M9903 Segmental and somatic dysfunction of lumbar region: Secondary | ICD-10-CM | POA: Diagnosis not present

## 2023-05-09 DIAGNOSIS — M9904 Segmental and somatic dysfunction of sacral region: Secondary | ICD-10-CM | POA: Diagnosis not present

## 2023-05-09 DIAGNOSIS — M531 Cervicobrachial syndrome: Secondary | ICD-10-CM | POA: Diagnosis not present

## 2023-05-09 DIAGNOSIS — M9901 Segmental and somatic dysfunction of cervical region: Secondary | ICD-10-CM | POA: Diagnosis not present

## 2023-05-11 DIAGNOSIS — M9901 Segmental and somatic dysfunction of cervical region: Secondary | ICD-10-CM | POA: Diagnosis not present

## 2023-05-11 DIAGNOSIS — M9903 Segmental and somatic dysfunction of lumbar region: Secondary | ICD-10-CM | POA: Diagnosis not present

## 2023-05-11 DIAGNOSIS — M9902 Segmental and somatic dysfunction of thoracic region: Secondary | ICD-10-CM | POA: Diagnosis not present

## 2023-05-11 DIAGNOSIS — M531 Cervicobrachial syndrome: Secondary | ICD-10-CM | POA: Diagnosis not present

## 2023-05-11 DIAGNOSIS — M9904 Segmental and somatic dysfunction of sacral region: Secondary | ICD-10-CM | POA: Diagnosis not present

## 2023-05-15 DIAGNOSIS — M9904 Segmental and somatic dysfunction of sacral region: Secondary | ICD-10-CM | POA: Diagnosis not present

## 2023-05-15 DIAGNOSIS — M9902 Segmental and somatic dysfunction of thoracic region: Secondary | ICD-10-CM | POA: Diagnosis not present

## 2023-05-15 DIAGNOSIS — M9903 Segmental and somatic dysfunction of lumbar region: Secondary | ICD-10-CM | POA: Diagnosis not present

## 2023-05-15 DIAGNOSIS — M531 Cervicobrachial syndrome: Secondary | ICD-10-CM | POA: Diagnosis not present

## 2023-05-15 DIAGNOSIS — M9901 Segmental and somatic dysfunction of cervical region: Secondary | ICD-10-CM | POA: Diagnosis not present

## 2023-05-18 DIAGNOSIS — M9902 Segmental and somatic dysfunction of thoracic region: Secondary | ICD-10-CM | POA: Diagnosis not present

## 2023-05-18 DIAGNOSIS — M531 Cervicobrachial syndrome: Secondary | ICD-10-CM | POA: Diagnosis not present

## 2023-05-18 DIAGNOSIS — M9904 Segmental and somatic dysfunction of sacral region: Secondary | ICD-10-CM | POA: Diagnosis not present

## 2023-05-18 DIAGNOSIS — M9903 Segmental and somatic dysfunction of lumbar region: Secondary | ICD-10-CM | POA: Diagnosis not present

## 2023-05-18 DIAGNOSIS — M9901 Segmental and somatic dysfunction of cervical region: Secondary | ICD-10-CM | POA: Diagnosis not present

## 2023-05-23 DIAGNOSIS — M531 Cervicobrachial syndrome: Secondary | ICD-10-CM | POA: Diagnosis not present

## 2023-05-23 DIAGNOSIS — M9902 Segmental and somatic dysfunction of thoracic region: Secondary | ICD-10-CM | POA: Diagnosis not present

## 2023-05-23 DIAGNOSIS — M9901 Segmental and somatic dysfunction of cervical region: Secondary | ICD-10-CM | POA: Diagnosis not present

## 2023-05-23 DIAGNOSIS — M9903 Segmental and somatic dysfunction of lumbar region: Secondary | ICD-10-CM | POA: Diagnosis not present

## 2023-05-23 DIAGNOSIS — M9904 Segmental and somatic dysfunction of sacral region: Secondary | ICD-10-CM | POA: Diagnosis not present

## 2023-07-12 DIAGNOSIS — S0011XA Contusion of right eyelid and periocular area, initial encounter: Secondary | ICD-10-CM | POA: Diagnosis not present
